# Patient Record
Sex: Male | Born: 1950
Health system: Southern US, Community
[De-identification: ages and names within clinical notes are randomized; demographics above are authoritative.]

## PROBLEM LIST (undated history)

## (undated) DIAGNOSIS — S3992XA Unspecified injury of lower back, initial encounter: Secondary | ICD-10-CM

## (undated) DIAGNOSIS — E785 Hyperlipidemia, unspecified: Secondary | ICD-10-CM

## (undated) DIAGNOSIS — R3129 Other microscopic hematuria: Secondary | ICD-10-CM

## (undated) DIAGNOSIS — H269 Unspecified cataract: Secondary | ICD-10-CM

## (undated) DIAGNOSIS — S32000A Wedge compression fracture of unspecified lumbar vertebra, initial encounter for closed fracture: Secondary | ICD-10-CM

## (undated) HISTORY — PX: WISDOM TOOTH EXTRACTION: SHX21

## (undated) HISTORY — DX: Hyperlipidemia, unspecified: E78.5

## (undated) HISTORY — PX: COLONOSCOPY: SHX174

## (undated) HISTORY — DX: Unspecified injury of lower back, initial encounter: S39.92XA

## (undated) HISTORY — DX: Other microscopic hematuria: R31.29

## (undated) HISTORY — PX: HERNIA REPAIR: SHX51

## (undated) HISTORY — DX: Wedge compression fracture of unspecified lumbar vertebra, initial encounter for closed fracture: S32.000A

## (undated) HISTORY — DX: Unspecified cataract: H26.9

---

## 1977-09-26 DIAGNOSIS — S32000A Wedge compression fracture of unspecified lumbar vertebra, initial encounter for closed fracture: Secondary | ICD-10-CM

## 1977-09-26 HISTORY — DX: Wedge compression fracture of unspecified lumbar vertebra, initial encounter for closed fracture: S32.000A

## 1983-12-31 DIAGNOSIS — S3992XA Unspecified injury of lower back, initial encounter: Secondary | ICD-10-CM

## 1983-12-31 HISTORY — DX: Unspecified injury of lower back, initial encounter: S39.92XA

## 1985-11-26 DIAGNOSIS — R3129 Other microscopic hematuria: Secondary | ICD-10-CM

## 1985-11-26 HISTORY — DX: Other microscopic hematuria: R31.29

## 1998-06-13 ENCOUNTER — Encounter: Admission: RE | Admit: 1998-06-13 | Discharge: 1998-06-13 | Payer: Self-pay | Admitting: Family Medicine

## 1998-06-17 ENCOUNTER — Encounter: Admission: RE | Admit: 1998-06-17 | Discharge: 1998-06-17 | Payer: Self-pay | Admitting: Family Medicine

## 1998-06-17 DIAGNOSIS — D229 Melanocytic nevi, unspecified: Secondary | ICD-10-CM

## 1998-06-17 HISTORY — DX: Melanocytic nevi, unspecified: D22.9

## 1999-06-18 ENCOUNTER — Encounter: Admission: RE | Admit: 1999-06-18 | Discharge: 1999-06-18 | Payer: Self-pay | Admitting: Family Medicine

## 1999-12-31 ENCOUNTER — Encounter: Admission: RE | Admit: 1999-12-31 | Discharge: 1999-12-31 | Payer: Self-pay | Admitting: Family Medicine

## 2000-02-23 ENCOUNTER — Encounter: Admission: RE | Admit: 2000-02-23 | Discharge: 2000-02-23 | Payer: Self-pay | Admitting: Sports Medicine

## 2000-03-17 ENCOUNTER — Encounter: Admission: RE | Admit: 2000-03-17 | Discharge: 2000-03-17 | Payer: Self-pay | Admitting: Family Medicine

## 2000-04-26 ENCOUNTER — Encounter: Admission: RE | Admit: 2000-04-26 | Discharge: 2000-04-26 | Payer: Self-pay | Admitting: Family Medicine

## 2001-06-06 ENCOUNTER — Encounter: Admission: RE | Admit: 2001-06-06 | Discharge: 2001-06-06 | Payer: Self-pay | Admitting: Family Medicine

## 2002-05-01 ENCOUNTER — Encounter: Admission: RE | Admit: 2002-05-01 | Discharge: 2002-05-01 | Payer: Self-pay | Admitting: Family Medicine

## 2003-06-18 ENCOUNTER — Encounter: Admission: RE | Admit: 2003-06-18 | Discharge: 2003-06-18 | Payer: Self-pay | Admitting: Family Medicine

## 2004-07-14 ENCOUNTER — Encounter: Admission: RE | Admit: 2004-07-14 | Discharge: 2004-07-14 | Payer: Self-pay | Admitting: Family Medicine

## 2004-09-15 ENCOUNTER — Ambulatory Visit: Payer: Self-pay | Admitting: Family Medicine

## 2004-09-24 ENCOUNTER — Ambulatory Visit (HOSPITAL_COMMUNITY): Admission: RE | Admit: 2004-09-24 | Discharge: 2004-09-24 | Payer: Self-pay | Admitting: Gastroenterology

## 2005-08-10 ENCOUNTER — Ambulatory Visit: Payer: Self-pay | Admitting: Family Medicine

## 2005-09-26 HISTORY — PX: LASIK: SHX215

## 2006-07-27 HISTORY — PX: NEVUS EXCISION: SHX2090

## 2006-08-23 ENCOUNTER — Ambulatory Visit: Payer: Self-pay | Admitting: Family Medicine

## 2006-10-18 ENCOUNTER — Ambulatory Visit: Payer: Self-pay | Admitting: Family Medicine

## 2006-11-01 ENCOUNTER — Ambulatory Visit: Payer: Self-pay | Admitting: Family Medicine

## 2006-11-22 ENCOUNTER — Ambulatory Visit: Payer: Self-pay | Admitting: Family Medicine

## 2007-02-23 DIAGNOSIS — Z87448 Personal history of other diseases of urinary system: Secondary | ICD-10-CM

## 2007-02-23 DIAGNOSIS — M545 Low back pain: Secondary | ICD-10-CM | POA: Insufficient documentation

## 2007-02-23 DIAGNOSIS — I10 Essential (primary) hypertension: Secondary | ICD-10-CM | POA: Insufficient documentation

## 2007-02-23 DIAGNOSIS — E785 Hyperlipidemia, unspecified: Secondary | ICD-10-CM

## 2007-02-23 DIAGNOSIS — L719 Rosacea, unspecified: Secondary | ICD-10-CM

## 2007-03-03 ENCOUNTER — Encounter: Payer: Self-pay | Admitting: Family Medicine

## 2007-03-08 ENCOUNTER — Encounter: Payer: Self-pay | Admitting: Family Medicine

## 2007-03-09 ENCOUNTER — Encounter: Payer: Self-pay | Admitting: Family Medicine

## 2007-03-13 ENCOUNTER — Encounter: Payer: Self-pay | Admitting: Family Medicine

## 2007-03-14 ENCOUNTER — Ambulatory Visit: Payer: Self-pay | Admitting: Family Medicine

## 2007-03-14 LAB — CONVERTED CEMR LAB: Cholesterol, target level: 200 mg/dL

## 2007-03-15 LAB — CONVERTED CEMR LAB
BUN: 22 mg/dL (ref 6–23)
CO2: 24 meq/L (ref 19–32)
Calcium: 9.1 mg/dL (ref 8.4–10.5)
Glucose, Bld: 86 mg/dL (ref 70–99)
Potassium: 4.3 meq/L (ref 3.5–5.3)

## 2007-09-12 ENCOUNTER — Ambulatory Visit: Payer: Self-pay | Admitting: Family Medicine

## 2007-09-12 LAB — CONVERTED CEMR LAB
AST: 32 units/L (ref 0–37)
Alkaline Phosphatase: 49 units/L (ref 39–117)
BUN: 24 mg/dL — ABNORMAL HIGH (ref 6–23)
Cholesterol: 156 mg/dL (ref 0–200)
Creatinine, Ser: 0.98 mg/dL (ref 0.40–1.50)
Glucose, Bld: 91 mg/dL (ref 70–99)
HDL: 42 mg/dL (ref >39)
Hemoglobin: 14.1 g/dL (ref 13.0–17.0)
MCHC: 33.2 g/dL (ref 30.0–36.0)
PSA: 1.21 ng/mL (ref 0.10–4.00)
Platelets: 167 10*3/uL (ref 150–400)
Sodium: 136 meq/L (ref 135–145)
Total Bilirubin: 0.6 mg/dL (ref 0.3–1.2)
Total CHOL/HDL Ratio: 3.7
VLDL: 19 mg/dL (ref 0–40)
WBC: 4.5 10*3/uL (ref 4.0–10.5)

## 2007-09-15 ENCOUNTER — Encounter: Payer: Self-pay | Admitting: Family Medicine

## 2007-10-03 ENCOUNTER — Telehealth: Payer: Self-pay | Admitting: Family Medicine

## 2007-10-05 ENCOUNTER — Ambulatory Visit: Payer: Self-pay | Admitting: Family Medicine

## 2008-03-11 ENCOUNTER — Encounter: Payer: Self-pay | Admitting: Family Medicine

## 2008-09-17 ENCOUNTER — Ambulatory Visit: Payer: Self-pay | Admitting: Family Medicine

## 2008-09-17 LAB — CONVERTED CEMR LAB
Albumin: 4.6 g/dL (ref 3.5–5.2)
CO2: 25 meq/L (ref 19–32)
Calcium: 9.9 mg/dL (ref 8.4–10.5)
Chloride: 100 meq/L (ref 96–112)
Cholesterol: 197 mg/dL (ref 0–200)
Creatinine, Ser: 1.05 mg/dL (ref 0.40–1.50)
Glucose, Bld: 93 mg/dL (ref 70–99)
Ketones, urine, test strip: NEGATIVE
LDL Cholesterol: 133 mg/dL — ABNORMAL HIGH (ref 0–99)
Nitrite: NEGATIVE
Total Protein: 7.1 g/dL (ref 6.0–8.3)
Triglycerides: 63 mg/dL (ref <150)
Urobilinogen, UA: 0.2
VLDL: 13 mg/dL (ref 0–40)
WBC Urine, dipstick: NEGATIVE

## 2008-09-18 ENCOUNTER — Encounter: Payer: Self-pay | Admitting: Family Medicine

## 2009-09-23 ENCOUNTER — Ambulatory Visit: Payer: Self-pay | Admitting: Family Medicine

## 2009-09-26 ENCOUNTER — Ambulatory Visit: Payer: Self-pay | Admitting: Family Medicine

## 2009-09-26 ENCOUNTER — Encounter: Payer: Self-pay | Admitting: Family Medicine

## 2009-09-26 LAB — CONVERTED CEMR LAB
ALT: 26 units/L (ref 0–53)
Albumin: 4.1 g/dL (ref 3.5–5.2)
BUN: 22 mg/dL (ref 6–23)
Calcium: 8.9 mg/dL (ref 8.4–10.5)
Chloride: 101 meq/L (ref 96–112)
Cholesterol: 156 mg/dL (ref 0–200)
HCT: 42.6 % (ref 39.0–52.0)
MCHC: 32.6 g/dL (ref 30.0–36.0)
MCV: 94.7 fL (ref 78.0–100.0)
Platelets: 198 10*3/uL (ref 150–400)
Potassium: 4.7 meq/L (ref 3.5–5.3)
RDW: 12.7 % (ref 11.5–15.5)
Total Protein: 6.5 g/dL (ref 6.0–8.3)
Triglycerides: 48 mg/dL (ref <150)
WBC: 5.2 10*3/uL (ref 4.0–10.5)

## 2009-10-03 ENCOUNTER — Encounter: Payer: Self-pay | Admitting: Family Medicine

## 2009-10-30 ENCOUNTER — Encounter: Payer: Self-pay | Admitting: Family Medicine

## 2010-01-08 ENCOUNTER — Inpatient Hospital Stay (HOSPITAL_COMMUNITY): Admission: EM | Admit: 2010-01-08 | Discharge: 2010-01-10 | Payer: Self-pay | Admitting: Emergency Medicine

## 2010-01-15 ENCOUNTER — Telehealth: Payer: Self-pay | Admitting: *Deleted

## 2010-01-20 ENCOUNTER — Ambulatory Visit: Payer: Self-pay | Admitting: Family Medicine

## 2010-02-10 ENCOUNTER — Telehealth: Payer: Self-pay | Admitting: Family Medicine

## 2010-02-10 DIAGNOSIS — R7401 Elevation of levels of liver transaminase levels: Secondary | ICD-10-CM | POA: Insufficient documentation

## 2010-02-10 DIAGNOSIS — R740 Nonspecific elevation of levels of transaminase and lactic acid dehydrogenase [LDH]: Secondary | ICD-10-CM

## 2010-02-12 ENCOUNTER — Encounter: Payer: Self-pay | Admitting: Family Medicine

## 2010-02-12 ENCOUNTER — Ambulatory Visit: Payer: Self-pay | Admitting: Family Medicine

## 2010-02-13 LAB — CONVERTED CEMR LAB
ALT: 21 units/L (ref 0–53)
AST: 18 units/L (ref 0–37)
Alkaline Phosphatase: 58 units/L (ref 39–117)
Indirect Bilirubin: 0.4 mg/dL (ref 0.0–0.9)

## 2010-03-03 ENCOUNTER — Ambulatory Visit: Payer: Self-pay | Admitting: Family Medicine

## 2010-03-03 DIAGNOSIS — S42009A Fracture of unspecified part of unspecified clavicle, initial encounter for closed fracture: Secondary | ICD-10-CM | POA: Insufficient documentation

## 2010-03-04 ENCOUNTER — Encounter: Payer: Self-pay | Admitting: Family Medicine

## 2010-03-10 ENCOUNTER — Telehealth: Payer: Self-pay | Admitting: Family Medicine

## 2010-09-22 ENCOUNTER — Telehealth: Payer: Self-pay | Admitting: *Deleted

## 2010-09-30 ENCOUNTER — Encounter: Payer: Self-pay | Admitting: Family Medicine

## 2010-09-30 ENCOUNTER — Ambulatory Visit: Payer: Self-pay | Admitting: Family Medicine

## 2010-09-30 LAB — CONVERTED CEMR LAB
ALT: 24 units/L (ref 0–53)
AST: 20 units/L (ref 0–37)
Albumin: 4.4 g/dL (ref 3.5–5.2)
Cholesterol: 160 mg/dL (ref 0–200)
HDL: 41 mg/dL (ref >39)
Potassium: 4.5 meq/L (ref 3.5–5.3)
Sodium: 135 meq/L (ref 135–145)
Total Protein: 6.6 g/dL (ref 6.0–8.3)
Triglycerides: 81 mg/dL (ref <150)
VLDL: 16 mg/dL (ref 0–40)

## 2010-10-20 ENCOUNTER — Ambulatory Visit: Payer: Self-pay | Admitting: Family Medicine

## 2010-10-20 DIAGNOSIS — Z6826 Body mass index (BMI) 26.0-26.9, adult: Secondary | ICD-10-CM

## 2010-10-20 DIAGNOSIS — M722 Plantar fascial fibromatosis: Secondary | ICD-10-CM | POA: Insufficient documentation
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
## 2011-01-26 NOTE — Progress Notes (Signed)
----   Converted from flag ---- ---- 09/22/2010 12:19 PM, [name] wrote: Please contact him to schedule lipid profile then visit for blood pressure follow-up ------------------------------  called pt to schedule fasting lab appt and f/u with Dr [name]. says he will call tomorrow morning to schedule both appts.

## 2011-01-26 NOTE — Assessment & Plan Note (Signed)
Summary: cpe, hypertension/eo   Vital Signs:  Patient Profile:   60 Years Old Male Height:     66 inches Weight:      173 pounds BMI:     28.02 Temp:     98.6 degrees F Pulse rate:   80 / minute BP sitting:   128 / 85  Vitals Entered By: Lillia Pauls CMA (September 17, 2008 10:14 AM)                Flu Vaccine Result Date:  09/17/2008 Flu Vaccine Result:  given Flu Vaccine Next Due:  1 yr Last Hemoccult Result: Done. (05/28/2003 12:00:00 AM) Hemoccult Next Due:  Not Indicated   PCP:  [name]  Chief Complaint:  cpe.  History of Present Illness: Feeling well, played a lot of tennis in the summer. Less exercise past month while getting 2 large courses underway, but plans to increase it.  Dr Ignacia Bayley surveyed skin last May. No new problems. Acne rosacea doing better since on Minocycline  Hasn't  seen Dr Melvyn Neth, chiro in past year, but gets [employer] for massages.  Was getting cramps in legs but recently better.   Has nocturia once nightly but no other urinary symptoms.   Hypertension History:      He denies headache, chest pain, palpitations, dyspnea with exertion, orthopnea, PND, peripheral edema, visual symptoms, neurologic problems, syncope, and side effects from treatment.  He notes no problems with any antihypertensive medication side effects.  Further comments include: Gained weight since less exercise than in the summer.        Positive major cardiovascular risk factors include male age 43 years old or older, hyperlipidemia, and hypertension.  Negative major cardiovascular risk factors include no history of diabetes, negative family history for ischemic heart disease, and non-tobacco-user status.        Further assessment for target organ damage reveals no history of ASHD, cardiac end-organ damage (CHF/LVH), stroke/TIA, peripheral vascular disease, renal insufficiency, or hypertensive retinopathy.        Current Allergies: No known allergies     Risk  Factors:  Colonoscopy History:     Date of Last Colonoscopy:  08/27/2004    Review of Systems  The patient denies anorexia, fever, weight loss, weight gain, vision loss, decreased hearing, hoarseness, chest pain, syncope, dyspnea on exertion, peripheral edema, prolonged cough, headaches, abdominal pain, melena, hematochezia, and severe indigestion/heartburn.     Physical Exam  General:     Well-developed,well-nourished,in no acute distress; alert,appropriate and cooperative throughout examination Head:     Normocephalic and atraumatic without obvious abnormalities.  Eyes:     pupils equal, pupils round, pupils reactive to light, and pupils react to accomodation. Fundi normal Ears:     External ear exam shows no significant lesions or deformities.  Otoscopic examination reveals clear canals, tympanic membranes are intact bilaterally without bulging, retraction, inflammation or discharge. Hearing is grossly normal bilaterally. Nose:     External nasal examination shows no deformity or inflammation. Nasal mucosa are pink and moist without lesions or exudates. Mouth:     good dentition and mild gingival recession.   Neck:     No deformities, masses, or tenderness noted. Chest Wall:     No deformities, masses, tenderness or gynecomastia noted. Lungs:     Normal respiratory effort, chest expands symmetrically. Lungs are clear to auscultation, no crackles or wheezes. Heart:     Normal rate and regular rhythm. S1 and S2 normal without gallop, murmur,  click, rub or other extra sounds. Abdomen:     Bowel sounds positive,abdomen soft and non-tender without masses, organomegaly or hernias noted. Genitalia:     Testes bilaterally descended without nodularity, tenderness or masses. No scrotal masses or lesions. No penis lesions or urethral discharge. Msk:     No deformity or scoliosis noted of thoracic or lumbar spine.   Pulses:     R and L dorsalis pedis and posterior tibial pulses are  full and equal bilaterally Neurologic:     No cranial nerve deficits noted. Station and gait are normal. DTRs are 3+ symmetrical throughout except 4+ R knee.  Sensory, motor and coordinative functions appear intact. Skin:     Multiple nevi on tanned back. None with worrisome symptoms. Mild flaking of forehead with erythema fo T zone and cheeks Cervical Nodes:     No lymphadenopathy noted Psych:     Cognition and judgment appear intact. Alert and cooperative with normal attention span and concentration. No apparent delusions, illusions, hallucinations    Impression & Recommendations:  Problem # 1:  HYPERTENSION, BENIGN SYSTEMIC (ICD-401.1) Assessment: Unchanged  His updated medication list for this problem includes:    Lisinopril-hydrochlorothiazide 10-12.5 Mg Tabs (Lisinopril-hydrochlorothiazide) .Marland Kitchen... Take 1 tablet by mouth once a day  Orders: [facility]- Est  Level 4 (99214) Comp Met-[facility] ([address]) Urinalysis-[facility] (00000)   Problem # 2:  ROSACEA (ICD-695.3) Assessment: Unchanged  Orders: [facility]- Est  Level 4 (99214)  Continue Minocycline    Problem # 3:  HYPERLIPIDEMIA (ICD-272.4) Need to see effectiveness of Simvastatin His updated medication list for this problem includes:    Simvastatin 40 Mg Tabs (Simvastatin) .Marland Kitchen... Take one tablet daily at bedtime  Orders: [facility]- Est  Level 4 (99214) Lipid-[facility] (09811-91478) Comp Met-[facility] (29562-13086)   Problem # 4:  HEMATURIA (ICD-599.7) Hasn't seen gross hematuria His updated medication list for this problem includes:    Minocycline Hcl 50 Mg Tabs (Minocycline hcl) .Marland Kitchen... Take 1/2 tablet by mouth once a day  Orders: Urinalysis-[facility] (00000)   Problem # 5:  BACK PAIN, LOW (ICD-724.2) Assessment: Improved  His updated medication list for this problem includes:    Bayer Childrens Aspirin 81 Mg Chew (Aspirin) .Marland Kitchen... Take 1 tablet by mouth every other day   Complete Medication List: 1)  Bayer Childrens Aspirin 81 Mg Chew (Aspirin)  .... Take 1 tablet by mouth every other day 2)  Lisinopril-hydrochlorothiazide 10-12.5 Mg Tabs (Lisinopril-hydrochlorothiazide) .... Take 1 tablet by mouth once a day 3)  Minocycline Hcl 50 Mg Tabs (Minocycline hcl) .... Take 1/2 tablet by mouth once a day 4)  Therapeutic Multivitamin Tabs (Multiple vitamin) .... Take one tablet daily 5)  Simvastatin 40 Mg Tabs (Simvastatin) .... Take one tablet daily at bedtime 6)  Fish Oil Concentrate 1000 Mg Caps (Omega-3 fatty acids) .... Take 2 tabs daily  Other Orders: PSA (Medicare)-[facility] (G0103)  Anticoagulation Management Assessment/Plan:       Hypertension Assessment/Plan:      The patient's hypertensive risk group is category B: At least one risk factor (excluding diabetes) with no target organ damage.  His calculated 10 year risk of coronary heart disease is 7 %.  Today's blood pressure is 128/85.     Patient Instructions: 1)  Please schedule a follow-up appointment in 1 year. 2)  Limit your Sodium (Salt). 3)  It is important that you exercise regularly at least 20 minutes 5 times a week. If you develop chest pain, have severe difficulty breathing, or feel very tired ,  stop exercising immediately and seek medical attention.   ]    Influenza Vaccine    Vaccine Type: FLUARIX    Site: left deltoid    Mfr: Gl[employer]oSmithKline    Dose: 0.5 ml    Route: IM    Given by: Lillia Pauls CMA    Exp. Date: 06/25/2009    Lot #: [id number]    VIS given: 07/20/07 version given September 17, 2008.   Laboratory Results   Urine Tests  Date/Time Received: September 17, 2008 11:11PM  Date/Time Reported: September 17, 2008 12:45 PM   Routine Urinalysis   Color: yellow Appearance: Clear Glucose: negative   (Normal Range: Negative) Bilirubin: negative   (Normal Range: Negative) Ketone: negative   (Normal Range: Negative) Spec. Gravity: 1.010   (Normal Range: 1.003-1.035) Blood: moderate   (Normal Range: Negative) pH: 6.0   (Normal Range:  5.0-8.0) Protein: negative   (Normal Range: Negative) Urobilinogen: 0.2   (Normal Range: 0-1) Nitrite: negative   (Normal Range: Negative) Leukocyte Esterace: negative   (Normal Range: Negative)  Urine Microscopic WBC/HPF: rare RBC/HPF: 1-5 Bacteria/HPF: 1+ Epithelial/HPF: rare    Comments: ...........test performed by...........Marland KitchenTerese Door, CMA
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
## 2011-01-26 NOTE — Progress Notes (Signed)
Summary: Increase in lisinopril  Phone Note Call from Patient   Caller: email from patient Summary of Call: Below are blood pressure readings.  3/9 144/85 7:45 am 134/70 2 pm   3/10 143/83 6:30 am 136/79 7:50 pm   3/11 157/78 2:45   3/12 146/77 6:40 am 152/72 2 pm   3/13 144/82 7 am     Follow-up for Phone Call        Your systolics are mostly above 140 so I'd like to increase the Lisinopril in your blood pressure medication. No rush, so you'll just get the larger dose in your next refill. Please make a follow-up appointment in 2-3 months Follow-up by: [name],  March 10, 2010 5:00 PM    New/Updated Medications: LISINOPRIL-HYDROCHLOROTHIAZIDE 20-12.5 MG TABS (LISINOPRIL-HYDROCHLOROTHIAZIDE) Take one tablet daily Prescriptions: LISINOPRIL-HYDROCHLOROTHIAZIDE 20-12.5 MG TABS (LISINOPRIL-HYDROCHLOROTHIAZIDE) Take one tablet daily  #30 x 11   Entered and Authorized by:   [name]   Signed by:   [name] on 03/10/2010   Method used:   Electronically to        [facility]* (retail)       [address]       [place], [place]  [id number]       Ph: [phone]       Fax: [phone]   RxID:   [id number]

## 2011-01-26 NOTE — Letter (Signed)
Summary: Results Follow-up Letter  [facility]  [address]   [place], [place] [address]   Phone: [phone]  Fax: [phone]    09/18/2008  [address] [place], [place]  [address]  Dear [name], Patient: Sean Schwartz Note: All result statuses are Final unless otherwise noted.  Tests: (1) Comprehensive Metabolic Panel (53614)   Order Note: FASTING   Sodium                    137 mEq/L                   135-145   Potassium                 4.4 mEq/L                   3.5-5.3   Chloride                  100 mEq/L                   96-112   CO2                       25 mEq/L                    19-32   Glucose                   93 mg/dL                    43-15   BUN                       21 mg/dL                    4-00   Creatinine                1.05 mg/dL                  0.40-1.50   Bilirubin, Total          1.0 mg/dL                   8.6-7.6   Alkaline Phosphatase      46 U/L                      39-117   AST/SGOT                  22 U/L                      0-37   ALT/SGPT                  41 U/L                      0-53   Total Protein             7.1 g/dL                    1.9-5.0   Albumin                   4.6 g/dL  3.5-5.2   Calcium                   9.9 mg/dL                   4.5-40.9 No sign of kidney or liver problems Tests: (2) Lipid Profile (81191)   Cholesterol               197 mg/dL                   4-782         Triglyceride              63 mg/dL                    <956   HDL Cholesterol           51 mg/dL                    >21   Total Chol/HDL Ratio      3.9 Ratio  VLDL Cholesterol (Calc)                             13 mg/dL                    3-08  LDL Cholesterol (Calc)                        [H]  133 mg/dL                   6-57           Total Cholesterol/HDL Ratio:CHD Risk                            Coronary Heart Disease Risk Table                                            Men       Women             1/2 Average Risk              3.4        3.3                  Average Risk              5.0        4.4              2 X Average Risk              9.6        7.1              3 X Average Risk             23.4       11.0     Use the calculated Patient Ratio above and the CHD Risk table      to determine the patient's CHD Risk.     ATP III Classification (LDL):           < 100        mg/dL  Optimal          100 - 129     mg/dL         Near or Above Optimal          130 - 159     mg/dL         Borderline High          160 - 189     mg/dL         High           > 190        mg/dL         Very High Since you are taking medication, we should get the LDL below 130, so I sent in to your pharmacy for the subsequent dose to be Simvastatin 80 mg. I don't think we need repeat the lipid profile for a year.      Tests: (3) PSA ([address])   PSA                       2.09 ng/mL                  0.10-4.00 This has risen from 1.21 so we'll recheck it next year.  Document Creation Date: 09/18/2008 7:22 AM Sincerely,  [name] [facility]           Appended Document: Increase Simvastatin to 80 mg    Clinical Lists Changes  Medications: Changed medication from SIMVASTATIN 40 MG  TABS (SIMVASTATIN) Take one tablet daily at bedtime to SIMVASTATIN 80 MG TABS (SIMVASTATIN) Take one tablet daily at bedtime - Signed Rx of SIMVASTATIN 80 MG TABS (SIMVASTATIN) Take one tablet daily at bedtime;  #30 x 11;  Signed;  Entered by: [name];  Authorized by: [name];  Method used: Electronically to [facility]*, [address] [place], [place]  [id number], Ph: [phone], Fax: [phone]    Prescriptions: SIMVASTATIN 80 MG TABS (SIMVASTATIN) Take one tablet daily at bedtime  #30 x 11   Entered and Authorized by:   [name]   Signed by:   [name] on 09/18/2008   Method used:   Electronically to        [facility]* (retail)        [address]       [place], [place]  [id number]       Ph: [phone]       Fax: [phone]   RxID:   [id number]   Appended Document: Results Follow-up Letter sent

## 2011-01-26 NOTE — Letter (Signed)
Summary: Generic Letter  [facility]  [address]   [place], [place] [address]   Phone: [phone]  Fax: [phone]    03/04/2010  [facility] [address] [place], [place] [address]   Dear Sean Schwartz,  I examined Mr Sean Schwartz March 8th, 2011 using the standard alcoholism screens. On the CAGE he scored negative for any risk. On the short [place] Alcoholism Screening Test he scored 2 positives out of 13 which puts him in the possible risk range. Since I have treated him for a number of years for hypertension and hyperlipidemia, I have annual liver function tests and blood counts which have never shown indications of the effects of chronic excessive alcohol use. Based on these tests and my experience in treating him, I do not believe he needs referral for alcholism treatment. We discussed the risks of alcohol related to driving, and I recommended no alcohol intake proximate to driving a motor vehicle in the future.   Please contact me if any more specific information would be useful.     Sincerely,   Sean Dauer MD  Appended Document: Generic Letter mailed

## 2011-01-26 NOTE — Letter (Signed)
Summary: SMAST Questionnaire  SMAST Questionnaire   Imported By: Clydell Hakim 03/06/2010 10:18:38  _____________________________________________________________________  External Attachment:    Type:   Image     Comment:   External Document
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
## 2011-03-14 LAB — COMPREHENSIVE METABOLIC PANEL
Albumin: 4.5 g/dL (ref 3.5–5.2)
Alkaline Phosphatase: 37 U/L — ABNORMAL LOW (ref 39–117)
Alkaline Phosphatase: 47 U/L (ref 39–117)
BUN: 17 mg/dL (ref 6–23)
Calcium: 8.4 mg/dL (ref 8.4–10.5)
Chloride: 102 mEq/L (ref 96–112)
Creatinine, Ser: 0.84 mg/dL (ref 0.4–1.5)
Creatinine, Ser: 0.94 mg/dL (ref 0.4–1.5)
GFR calc Af Amer: >60 mL/min (ref >60)
Glucose, Bld: 140 mg/dL — ABNORMAL HIGH (ref 70–99)
Potassium: 4.4 mEq/L (ref 3.5–5.1)
Sodium: 139 mEq/L (ref 135–145)
Total Bilirubin: 0.9 mg/dL (ref 0.3–1.2)
Total Protein: 6 g/dL (ref 6.0–8.3)

## 2011-03-14 LAB — URINALYSIS, ROUTINE W REFLEX MICROSCOPIC
Glucose, UA: NEGATIVE mg/dL
Leukocytes, UA: NEGATIVE
Nitrite: NEGATIVE
Protein, ur: 30 mg/dL — AB
Specific Gravity, Urine: 1.011 (ref 1.005–1.030)
pH: 6.5 (ref 5.0–8.0)

## 2011-03-14 LAB — CBC
HCT: 46.4 % (ref 39.0–52.0)
Hemoglobin: 16.2 g/dL (ref 13.0–17.0)
MCHC: 35 g/dL (ref 30.0–36.0)
Platelets: 139 10*3/uL — ABNORMAL LOW (ref 150–400)
RDW: 12.8 % (ref 11.5–15.5)
WBC: 10.3 10*3/uL (ref 4.0–10.5)

## 2011-03-14 LAB — RAPID URINE DRUG SCREEN, HOSP PERFORMED
Opiates: NOT DETECTED
Tetrahydrocannabinol: NOT DETECTED

## 2011-03-14 LAB — DIFFERENTIAL
Basophils Relative: 1 % (ref 0–1)
Lymphocytes Relative: 28 % (ref 12–46)
Lymphs Abs: 2.3 10*3/uL (ref 0.7–4.0)
Monocytes Relative: 7 % (ref 3–12)
Neutrophils Relative %: 62 % (ref 43–77)

## 2011-03-14 LAB — URINE MICROSCOPIC-ADD ON

## 2011-03-14 LAB — PROTIME-INR: Prothrombin Time: 13.2 seconds (ref 11.6–15.2)

## 2011-03-14 LAB — APTT: aPTT: 27 seconds (ref 24–37)

## 2011-03-14 LAB — ETHANOL: Alcohol, Ethyl (B): 123 mg/dL — ABNORMAL HIGH (ref 0–10)

## 2011-04-15 ENCOUNTER — Other Ambulatory Visit: Payer: Self-pay | Admitting: Family Medicine

## 2011-04-15 NOTE — Telephone Encounter (Signed)
Refill request

## 2011-05-14 NOTE — Op Note (Signed)
Sean Schwartz, Sean Schwartz           ACCOUNT NO.:  [id number]   MEDICAL RECORD NO.:  [id number]          PATIENT TYPE:  AMB   LOCATION:  ENDO                         FACILITY:  [facility]   PHYSICIAN:  James L. Malon Kindle., M.D.DATE OF BIRTH:  1951-10-29   DATE OF PROCEDURE:  09/24/2004  DATE OF DISCHARGE:                                 OPERATIVE REPORT   PROCEDURE:  Colonoscopy.   MEDICATIONS:  Fentanyl 87.5 mcg, Versed 9 mg IV.   INDICATIONS FOR PROCEDURE:  Rectal bleeding.   INSTRUMENT USED:  Olympus pediatric adjustable colonoscope.   DESCRIPTION OF PROCEDURE:  The procedure had been explained to the patient  and consent obtained.  In the left lateral decubitus position, the Olympus  scope was inserted and advanced.  The prep was excellent.  We were able to  easily reach the cecum.  The ileocecal valve and appendiceal orifice were  seen.  The scope was withdrawn.  The cecum, ascending colon, transverse  colon, splenic flexure, descending, and sigmoid colon were seen well.  No  polyps were seen.  No significant diverticular disease.  In the rectum, no  polyps were seen.  There were moderate internal hemorrhoids.  No other  abnormalities were seen.  The scope was withdrawn.  The patient tolerated  the procedure well.   ASSESSMENT:  1.  Rectal bleeding probably due to internal hemorrhoids (578.1).  2.  Internal hemorrhoids (455.0).   PLAN:  Will given a hemorrhoid instruction sheet and see back on an as  needed basis.      JLE/MEDQ  D:  09/24/2004  T:  09/24/2004  Job:  [id number]   cc:   Deniece Portela A. [name], M.D.  Fax: [phone]
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
## 2011-09-11 ENCOUNTER — Other Ambulatory Visit: Payer: Self-pay | Admitting: Family Medicine

## 2011-09-11 DIAGNOSIS — E785 Hyperlipidemia, unspecified: Secondary | ICD-10-CM

## 2011-09-11 DIAGNOSIS — I10 Essential (primary) hypertension: Secondary | ICD-10-CM

## 2011-09-21 ENCOUNTER — Other Ambulatory Visit: Payer: BC Managed Care – PPO

## 2011-09-21 DIAGNOSIS — E785 Hyperlipidemia, unspecified: Secondary | ICD-10-CM

## 2011-09-21 DIAGNOSIS — I10 Essential (primary) hypertension: Secondary | ICD-10-CM

## 2011-09-21 LAB — COMPREHENSIVE METABOLIC PANEL
Albumin: 4.4 g/dL (ref 3.5–5.2)
Alkaline Phosphatase: 43 U/L (ref 39–117)
BUN: 21 mg/dL (ref 6–23)
CO2: 23 mEq/L (ref 19–32)
Glucose, Bld: 91 mg/dL (ref 70–99)
Potassium: 4.6 mEq/L (ref 3.5–5.3)
Total Bilirubin: 0.9 mg/dL (ref 0.3–1.2)

## 2011-09-21 LAB — LIPID PANEL
Cholesterol: 173 mg/dL (ref 0–200)
HDL: 56 mg/dL (ref >39)
Total CHOL/HDL Ratio: 3.1 Ratio

## 2011-09-21 NOTE — Progress Notes (Signed)
cmp and flp done today Sean Schwartz 

## 2011-10-19 ENCOUNTER — Encounter: Payer: Self-pay | Admitting: Family Medicine

## 2011-10-19 ENCOUNTER — Ambulatory Visit (INDEPENDENT_AMBULATORY_CARE_PROVIDER_SITE_OTHER): Payer: BC Managed Care – PPO | Admitting: Family Medicine

## 2011-10-19 VITALS — BP 124/80 | HR 77 | Ht 66.0 in | Wt 172.0 lb

## 2011-10-19 DIAGNOSIS — I10 Essential (primary) hypertension: Secondary | ICD-10-CM

## 2011-10-19 DIAGNOSIS — M545 Low back pain: Secondary | ICD-10-CM

## 2011-10-19 DIAGNOSIS — Z23 Encounter for immunization: Secondary | ICD-10-CM

## 2011-10-19 DIAGNOSIS — E785 Hyperlipidemia, unspecified: Secondary | ICD-10-CM

## 2011-10-19 MED ORDER — ZOSTER VACCINE LIVE 19400 UNT/0.65ML ~~LOC~~ SOLR: 0.6500 mL | Freq: Once | SUBCUTANEOUS | Status: DC

## 2011-10-19 NOTE — Patient Instructions (Signed)
Return in one year to see Dr [name], sooner as needed

## 2011-10-19 NOTE — Assessment & Plan Note (Signed)
well controlled  
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
## 2011-12-08 ENCOUNTER — Other Ambulatory Visit: Payer: Self-pay | Admitting: Family Medicine

## 2011-12-08 DIAGNOSIS — E785 Hyperlipidemia, unspecified: Secondary | ICD-10-CM

## 2011-12-08 MED ORDER — SIMVASTATIN 80 MG PO TABS: 80.0000 mg | ORAL_TABLET | Freq: Every day | ORAL | Status: DC

## 2012-02-07 ENCOUNTER — Other Ambulatory Visit: Payer: Self-pay | Admitting: Family Medicine

## 2012-02-07 NOTE — Telephone Encounter (Signed)
Refill request

## 2012-04-28 ENCOUNTER — Other Ambulatory Visit: Payer: Self-pay | Admitting: Family Medicine

## 2012-05-01 ENCOUNTER — Other Ambulatory Visit: Payer: Self-pay | Admitting: Family Medicine

## 2012-09-04 ENCOUNTER — Telehealth: Payer: Self-pay | Admitting: Family Medicine

## 2012-09-04 NOTE — Telephone Encounter (Signed)
Patient called to set up his Physical appt.  He also scheduled for the typical labs that are ordered for his Physical, but he needs orders put in for those labs.

## 2012-09-04 NOTE — Telephone Encounter (Signed)
Will forward to Dr Hale 

## 2012-10-16 ENCOUNTER — Other Ambulatory Visit: Payer: BC Managed Care – PPO

## 2012-10-16 ENCOUNTER — Telehealth: Payer: Self-pay | Admitting: *Deleted

## 2012-10-16 DIAGNOSIS — I10 Essential (primary) hypertension: Secondary | ICD-10-CM

## 2012-10-16 DIAGNOSIS — E785 Hyperlipidemia, unspecified: Secondary | ICD-10-CM

## 2012-10-16 NOTE — Progress Notes (Signed)
CMP AND FLP DONE TODAY PER VERBAL ORDER FROM DR. HALE Sotirios Navarro

## 2012-10-16 NOTE — Telephone Encounter (Signed)
Lab orders put in per Dr.Hale. Lorenda Hatchet, Renato Battles

## 2012-10-17 LAB — COMPREHENSIVE METABOLIC PANEL
ALT: 21 U/L (ref 0–53)
AST: 21 U/L (ref 0–37)
BUN: 18 mg/dL (ref 6–23)
Chloride: 100 mEq/L (ref 96–112)
Potassium: 4.5 mEq/L (ref 3.5–5.3)
Sodium: 138 mEq/L (ref 135–145)

## 2012-10-17 LAB — LIPID PANEL
Cholesterol: 169 mg/dL (ref 0–200)
Total CHOL/HDL Ratio: 3.4 Ratio
Triglycerides: 72 mg/dL (ref <150)
VLDL: 14 mg/dL (ref 0–40)

## 2012-10-23 ENCOUNTER — Encounter: Payer: Self-pay | Admitting: Family Medicine

## 2012-10-23 ENCOUNTER — Ambulatory Visit (INDEPENDENT_AMBULATORY_CARE_PROVIDER_SITE_OTHER): Payer: BC Managed Care – PPO | Admitting: Family Medicine

## 2012-10-23 VITALS — BP 135/78 | HR 88 | Ht 65.25 in | Wt 166.0 lb

## 2012-10-23 DIAGNOSIS — L719 Rosacea, unspecified: Secondary | ICD-10-CM

## 2012-10-23 DIAGNOSIS — G8929 Other chronic pain: Secondary | ICD-10-CM | POA: Insufficient documentation

## 2012-10-23 DIAGNOSIS — M899 Disorder of bone, unspecified: Secondary | ICD-10-CM

## 2012-10-23 DIAGNOSIS — E785 Hyperlipidemia, unspecified: Secondary | ICD-10-CM

## 2012-10-23 DIAGNOSIS — I10 Essential (primary) hypertension: Secondary | ICD-10-CM

## 2012-10-23 NOTE — Patient Instructions (Addendum)
Please return to see Dr [name] in 1 year.  Please try using the web portal, My chart  Great to see you doing so well.
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
## 2012-10-23 NOTE — Assessment & Plan Note (Signed)
well controlled  

## 2012-10-23 NOTE — Assessment & Plan Note (Signed)
Continues on Doxycycline

## 2012-10-23 NOTE — Assessment & Plan Note (Signed)
LDL adequately low. He wishes to continue the Simvastatin

## 2012-11-05 ENCOUNTER — Other Ambulatory Visit: Payer: Self-pay | Admitting: Family Medicine

## 2012-12-10 ENCOUNTER — Other Ambulatory Visit: Payer: Self-pay | Admitting: Family Medicine

## 2012-12-12 NOTE — Telephone Encounter (Signed)
Patient is calling asking for the refill on HCTZ to be added to the refill request sent by his pharmacy.  Also, his password for My Chart has expired and he would like a new one.

## 2012-12-12 NOTE — Telephone Encounter (Signed)
Fwd. To Dr.Hale

## 2013-10-26 ENCOUNTER — Other Ambulatory Visit: Payer: BC Managed Care – PPO

## 2013-10-29 ENCOUNTER — Ambulatory Visit (INDEPENDENT_AMBULATORY_CARE_PROVIDER_SITE_OTHER): Payer: BC Managed Care – PPO | Admitting: Family Medicine

## 2013-10-29 ENCOUNTER — Encounter: Payer: BC Managed Care – PPO | Admitting: Family Medicine

## 2013-10-29 VITALS — BP 132/74 | HR 74 | Temp 98.0°F | Resp 18 | Ht 65.25 in | Wt 163.4 lb

## 2013-10-29 DIAGNOSIS — I10 Essential (primary) hypertension: Secondary | ICD-10-CM

## 2013-10-29 DIAGNOSIS — Z23 Encounter for immunization: Secondary | ICD-10-CM

## 2013-10-29 DIAGNOSIS — E785 Hyperlipidemia, unspecified: Secondary | ICD-10-CM

## 2013-10-29 LAB — POCT URINALYSIS DIPSTICK
Bilirubin, UA: NEGATIVE
Glucose, UA: NEGATIVE
Ketones, UA: NEGATIVE
Leukocytes, UA: NEGATIVE
Nitrite, UA: NEGATIVE
Protein, UA: NEGATIVE
Spec Grav, UA: 1.015
Urobilinogen, UA: 0.2
pH, UA: 6

## 2013-10-29 LAB — POCT CBC
Granulocyte percent: 69.5 % (ref 37–80)
HCT, POC: 49 % (ref 43.5–53.7)
Hemoglobin: 15.8 g/dL (ref 14.1–18.1)
Lymph, poc: 1.2 (ref 0.6–3.4)
MCH, POC: 31.5 pg — AB (ref 27–31.2)
MCHC: 32.2 g/dL (ref 31.8–35.4)
MCV: 97.8 fL — AB (ref 80–97)
MID (cbc): 0.7 (ref 0–0.9)
MPV: 10 fL (ref 0–99.8)
POC Granulocyte: 4.4 (ref 2–6.9)
POC LYMPH PERCENT: 19.8 % (ref 10–50)
POC MID %: 10.7 % (ref 0–12)
Platelet Count, POC: 186 K/uL (ref 142–424)
RBC: 5.01 M/uL (ref 4.69–6.13)
RDW, POC: 13.4 %
WBC: 6.3 K/uL (ref 4.6–10.2)

## 2013-10-29 MED ORDER — INFLUENZA VAC SPLIT QUAD 0.5 ML IM SUSP: 0.5000 mL | INTRAMUSCULAR | Status: DC

## 2013-10-29 MED ORDER — SIMVASTATIN 80 MG PO TABS: 80.0000 mg | ORAL_TABLET | Freq: Every day | ORAL | Status: DC

## 2013-10-29 MED ORDER — DOXYCYCLINE HYCLATE 50 MG PO CAPS: 50.0000 mg | ORAL_CAPSULE | Freq: Every day | ORAL | Status: DC

## 2013-10-29 MED ORDER — LISINOPRIL-HYDROCHLOROTHIAZIDE 20-12.5 MG PO TABS: 1.0000 | ORAL_TABLET | Freq: Every day | ORAL | Status: DC
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
## 2013-10-30 ENCOUNTER — Encounter: Payer: Self-pay | Admitting: Family Medicine

## 2013-10-30 LAB — COMPREHENSIVE METABOLIC PANEL
ALT: 25 U/L (ref 0–53)
AST: 23 U/L (ref 0–37)
Albumin: 4.6 g/dL (ref 3.5–5.2)
Alkaline Phosphatase: 45 U/L (ref 39–117)
BUN: 17 mg/dL (ref 6–23)
CO2: 26 mEq/L (ref 19–32)
Calcium: 9.9 mg/dL (ref 8.4–10.5)
Chloride: 100 mEq/L (ref 96–112)
Creat: 0.95 mg/dL (ref 0.50–1.35)
Glucose, Bld: 88 mg/dL (ref 70–99)
Potassium: 4.7 mEq/L (ref 3.5–5.3)
Sodium: 137 mEq/L (ref 135–145)
Total Bilirubin: 0.8 mg/dL (ref 0.3–1.2)
Total Protein: 7.3 g/dL (ref 6.0–8.3)

## 2013-10-30 LAB — PSA: PSA: 2.53 ng/mL (ref <=4.00)

## 2013-10-30 LAB — LIPID PANEL
Cholesterol: 187 mg/dL (ref 0–200)
HDL: 59 mg/dL (ref >39)
LDL Cholesterol: 105 mg/dL — ABNORMAL HIGH (ref 0–99)
Total CHOL/HDL Ratio: 3.2 Ratio
Triglycerides: 115 mg/dL (ref <150)
VLDL: 23 mg/dL (ref 0–40)

## 2014-11-05 ENCOUNTER — Ambulatory Visit (INDEPENDENT_AMBULATORY_CARE_PROVIDER_SITE_OTHER): Payer: BC Managed Care – PPO | Admitting: Family Medicine

## 2014-11-05 VITALS — BP 130/62 | HR 112 | Temp 98.2°F | Resp 18 | Ht 66.5 in | Wt 163.2 lb

## 2014-11-05 DIAGNOSIS — Z23 Encounter for immunization: Secondary | ICD-10-CM

## 2014-11-05 DIAGNOSIS — Z0000 Encounter for general adult medical examination without abnormal findings: Secondary | ICD-10-CM

## 2014-11-05 DIAGNOSIS — L03221 Cellulitis of neck: Secondary | ICD-10-CM

## 2014-11-05 MED ORDER — AMOXICILLIN-POT CLAVULANATE 875-125 MG PO TABS: 1.0000 | ORAL_TABLET | Freq: Two times a day (BID) | ORAL | Status: DC

## 2014-11-05 MED ORDER — INFLUENZA VAC SPLIT QUAD 0.5 ML IM SUSY: 0.5000 mL | PREFILLED_SYRINGE | INTRAMUSCULAR | Status: DC

## 2014-11-05 NOTE — Progress Notes (Signed)
   Subjective:    Patient ID: BHAVIK CABINESS, male    DOB: 1951-07-30, 63 y.o.   MRN: [id number] This chart was scribed for Robyn Haber, MD by Zola Button, Medical Scribe. This patient was seen in Room 12 and the patient's care was started at 8:35 AM.   HPI HPI Comments: JACKY DROSS is a 63 y.o. male who presents to the [facility] complaining of an itching rash on his face/neck area that began yesterday. Patient thinks it may be due to shaving; he has not shaved in the area today. He has no other rashes. Patient has tried applying Aveeno to the area. He does not have allergies to penicillin. He has not had a flu shot yet, but would like to have it done today.  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   Review of Systems  Constitutional: Negative for fever.  HENT: Negative for ear discharge.   Eyes: Negative for discharge.  Respiratory: Negative for cough.   Cardiovascular: Negative for chest pain.  Gastrointestinal: Negative for diarrhea.  Genitourinary: Negative for hematuria.  Musculoskeletal: Negative for back pain.  Skin: Positive for rash.  Neurological: Negative for seizures.  Psychiatric/Behavioral: Negative for hallucinations.       Objective:   Physical Exam CONSTITUTIONAL: Well developed/well nourished HEAD: Normocephalic/atraumatic EYES: EOM/PERRL ENMT: Mucous membranes moist NECK: supple no meningeal signs SPINE: entire spine nontender CV: S1/S2 noted, no murmurs/rubs/gallops noted LUNGS: Lungs are clear to auscultation bilaterally, no apparent distress ABDOMEN: soft, nontender, no rebound or guarding GU: no cva tenderness NEURO: Pt is awake/alert, moves all extremitiesx4 EXTREMITIES: pulses normal, full ROM SKIN: warm, reddened and slightly thickened well-circumscribed area on the neck, anteriorly over the larynx and lower trachea with some scaling (exfoliation) an area approximately 4 x 5 cm PSYCH: no abnormalities  of mood noted        Assessment & Plan:   Preventative health care - Plan: DISCONTINUED: Influenza vac split quadrivalent PF (FLUARIX) injection 0.5 mL  Cellulitis of neck - Plan: amoxicillin-clavulanate (AUGMENTIN) 875-125 MG per tablet Follow-up next Monday with complete physical exam  Avoid shaving over affected area Signed, Robyn Haber, MD

## 2014-11-05 NOTE — Patient Instructions (Signed)

## 2014-11-11 ENCOUNTER — Encounter: Payer: Self-pay | Admitting: Family Medicine

## 2014-11-11 ENCOUNTER — Ambulatory Visit (INDEPENDENT_AMBULATORY_CARE_PROVIDER_SITE_OTHER): Payer: BC Managed Care – PPO | Admitting: Family Medicine

## 2014-11-11 VITALS — BP 116/60 | HR 69 | Temp 98.2°F | Resp 16 | Ht 65.5 in | Wt 159.6 lb

## 2014-11-11 DIAGNOSIS — I10 Essential (primary) hypertension: Secondary | ICD-10-CM

## 2014-11-11 DIAGNOSIS — Z0000 Encounter for general adult medical examination without abnormal findings: Secondary | ICD-10-CM

## 2014-11-11 DIAGNOSIS — L719 Rosacea, unspecified: Secondary | ICD-10-CM

## 2014-11-11 DIAGNOSIS — E785 Hyperlipidemia, unspecified: Secondary | ICD-10-CM

## 2014-11-11 DIAGNOSIS — L03221 Cellulitis of neck: Secondary | ICD-10-CM

## 2014-11-11 LAB — COMPLETE METABOLIC PANEL WITH GFR
ALT: 26 U/L (ref 0–53)
AST: 24 U/L (ref 0–37)
Albumin: 4.7 g/dL (ref 3.5–5.2)
Alkaline Phosphatase: 49 U/L (ref 39–117)
BUN: 20 mg/dL (ref 6–23)
CO2: 25 mEq/L (ref 19–32)
Calcium: 10.3 mg/dL (ref 8.4–10.5)
Chloride: 99 mEq/L (ref 96–112)
Creat: 0.86 mg/dL (ref 0.50–1.35)
GFR, Est African American: >89 mL/min
GFR, Est Non African American: >89 mL/min
Glucose, Bld: 86 mg/dL (ref 70–99)
Potassium: 4.6 mEq/L (ref 3.5–5.3)
Sodium: 136 mEq/L (ref 135–145)
Total Bilirubin: 1.1 mg/dL (ref 0.2–1.2)
Total Protein: 8.1 g/dL (ref 6.0–8.3)

## 2014-11-11 LAB — CBC WITH DIFFERENTIAL/PLATELET
Basophils Absolute: 0.1 10*3/uL (ref 0.0–0.1)
Basophils Relative: 1 % (ref 0–1)
Eosinophils Absolute: 0.2 10*3/uL (ref 0.0–0.7)
Eosinophils Relative: 3 % (ref 0–5)
HCT: 45.1 % (ref 39.0–52.0)
Hemoglobin: 15.9 g/dL (ref 13.0–17.0)
Lymphocytes Relative: 25 % (ref 12–46)
Lymphs Abs: 1.4 10*3/uL (ref 0.7–4.0)
MCH: 31.3 pg (ref 26.0–34.0)
MCHC: 35.3 g/dL (ref 30.0–36.0)
MCV: 88.8 fL (ref 78.0–100.0)
MPV: 10.3 fL (ref 9.4–12.4)
Monocytes Absolute: 0.7 10*3/uL (ref 0.1–1.0)
Monocytes Relative: 12 % (ref 3–12)
Neutro Abs: 3.3 10*3/uL (ref 1.7–7.7)
Neutrophils Relative %: 59 % (ref 43–77)
Platelets: 223 10*3/uL (ref 150–400)
RBC: 5.08 MIL/uL (ref 4.22–5.81)
RDW: 12.6 % (ref 11.5–15.5)
WBC: 5.6 10*3/uL (ref 4.0–10.5)

## 2014-11-11 LAB — POCT URINALYSIS DIPSTICK
Bilirubin, UA: NEGATIVE
Glucose, UA: NEGATIVE
Ketones, UA: NEGATIVE
Leukocytes, UA: NEGATIVE
Nitrite, UA: NEGATIVE
Protein, UA: NEGATIVE
Spec Grav, UA: <=1.005
Urobilinogen, UA: 0.2
pH, UA: 5

## 2014-11-11 LAB — LIPID PANEL
Cholesterol: 183 mg/dL (ref 0–200)
HDL: 61 mg/dL (ref >39)
LDL Cholesterol: 104 mg/dL — ABNORMAL HIGH (ref 0–99)
Total CHOL/HDL Ratio: 3 Ratio
Triglycerides: 89 mg/dL (ref <150)
VLDL: 18 mg/dL (ref 0–40)

## 2014-11-11 LAB — IFOBT (OCCULT BLOOD): IFOBT: NEGATIVE

## 2014-11-11 MED ORDER — LISINOPRIL-HYDROCHLOROTHIAZIDE 20-12.5 MG PO TABS: 1.0000 | ORAL_TABLET | Freq: Every day | ORAL | Status: DC

## 2014-11-11 MED ORDER — SIMVASTATIN 80 MG PO TABS: 80.0000 mg | ORAL_TABLET | Freq: Every day | ORAL | Status: DC

## 2014-11-11 MED ORDER — DOXYCYCLINE HYCLATE 100 MG PO TABS: 100.0000 mg | ORAL_TABLET | Freq: Every day | ORAL | Status: DC
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
## 2014-11-12 ENCOUNTER — Encounter: Payer: Self-pay | Admitting: Radiology

## 2014-11-12 LAB — PSA: PSA: 2.6 ng/mL (ref <=4.00)

## 2014-11-18 ENCOUNTER — Telehealth: Payer: Self-pay | Admitting: *Deleted

## 2014-11-18 NOTE — Telephone Encounter (Signed)
Dr. Joseph Schwartz, Pt called and states that he finished his antibiotics 3 days ago and the cellulitis on his neck has returned but not as bad as it was before. He wants to know if we should call him in another round of antibiotics. Please advise. [phone] He uses [facility]

## 2014-11-19 ENCOUNTER — Other Ambulatory Visit: Payer: Self-pay | Admitting: Family Medicine

## 2014-11-19 DIAGNOSIS — L03221 Cellulitis of neck: Secondary | ICD-10-CM

## 2014-11-19 MED ORDER — AMOXICILLIN-POT CLAVULANATE 875-125 MG PO TABS: 1.0000 | ORAL_TABLET | Freq: Two times a day (BID) | ORAL | Status: DC

## 2015-05-17 ENCOUNTER — Ambulatory Visit (INDEPENDENT_AMBULATORY_CARE_PROVIDER_SITE_OTHER): Payer: BC Managed Care – PPO | Admitting: Family Medicine

## 2015-05-17 VITALS — BP 130/70 | HR 79 | Temp 98.4°F | Resp 16 | Ht 64.5 in | Wt 163.4 lb

## 2015-05-17 DIAGNOSIS — S0181XA Laceration without foreign body of other part of head, initial encounter: Secondary | ICD-10-CM | POA: Diagnosis not present

## 2015-05-17 NOTE — Patient Instructions (Signed)
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
## 2015-05-18 ENCOUNTER — Encounter: Payer: Self-pay | Admitting: Family Medicine
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
## 2015-05-22 ENCOUNTER — Ambulatory Visit (INDEPENDENT_AMBULATORY_CARE_PROVIDER_SITE_OTHER): Payer: BC Managed Care – PPO | Admitting: Physician Assistant

## 2015-05-22 VITALS — BP 110/68 | HR 86 | Temp 98.2°F | Resp 18 | Ht 65.0 in | Wt 163.0 lb

## 2015-05-22 DIAGNOSIS — Z4802 Encounter for removal of sutures: Secondary | ICD-10-CM

## 2015-05-22 DIAGNOSIS — S0181XD Laceration without foreign body of other part of head, subsequent encounter: Secondary | ICD-10-CM

## 2015-05-22 NOTE — Progress Notes (Signed)
   05/22/2015 at 8:28 AM  Sean Schwartz / DOB: 03-Dec-1951 / MRN: [id number]  The patient has HYPERLIPIDEMIA; OVERWEIGHT; HYPERTENSION, BENIGN SYSTEMIC; Rosacea; BACK PAIN, LOW; HEMATURIA, MICROSCOPIC, HX OF; and Chronic scapular pain on his problem list.  SUBJECTIVE  Chief complaint: Suture / Staple Removal   Patient here for suture removal.  Denies bleeding, pain, tenderness, and exudate from the wound.  Negative for nausea and fever.   He  has a past medical history of Posttraumatic compression fracture of lumbar vertebra (09/1977); Back injury (1 & 03/1984); Microhematuria (11/1985); and Benign positional vertigo (06/2004).    Medications reviewed and updated by myself where necessary, and exist elsewhere in the encounter.   [name] has No Known Allergies. He  reports that he has never smoked. He has never used smokeless tobacco. He reports that he drinks about 1.8 - 2.4 oz of alcohol per week. He reports that he does not use illicit drugs. He  has no sexual activity history on file. The patient  has past surgical history that includes LASIK (09/2005) and Nevus excision (07/2006).  His family history includes Hypertension in his mother.  Review of Systems  Constitutional: Negative for fever and chills.  Skin: Negative for itching and rash.    OBJECTIVE  His  height is 5\' 5"  (1.651 m) and weight is 163 lb (73.936 kg). His oral temperature is 98.2 F (36.8 C). His blood pressure is 110/68 and his pulse is 86. His respiration is 18 and oxygen saturation is 98%.  The patient's body mass index is 27.12 kg/(m^2).  Physical Exam  Vitals reviewed. Constitutional: He appears well-developed and well-nourished.  HENT:  Head:      No results found for this or any previous visit (from the past 24 hour(s)).  ASSESSMENT & PLAN  Sean Schwartz was seen today for suture / staple removal.  Diagnoses and all orders for this visit:  Facial laceration, subsequent encounter: Wound healed.   Sutures removed.  Pt to follow up prn for this problem.     The patient was advised to call or come back to clinic if he does not see an improvement in symptoms, or worsens with the above plan.   Sean Schwartz, MHS, PA-C [facility] 05/22/2015 8:28 AM

## 2015-11-14 ENCOUNTER — Ambulatory Visit (INDEPENDENT_AMBULATORY_CARE_PROVIDER_SITE_OTHER): Payer: BC Managed Care – PPO | Admitting: Internal Medicine

## 2015-11-14 VITALS — BP 120/62 | HR 78 | Temp 98.1°F | Resp 16 | Ht 65.0 in | Wt 160.4 lb

## 2015-11-14 DIAGNOSIS — L719 Rosacea, unspecified: Secondary | ICD-10-CM | POA: Diagnosis not present

## 2015-11-14 DIAGNOSIS — Z0000 Encounter for general adult medical examination without abnormal findings: Secondary | ICD-10-CM

## 2015-11-14 DIAGNOSIS — E785 Hyperlipidemia, unspecified: Secondary | ICD-10-CM

## 2015-11-14 DIAGNOSIS — Z1211 Encounter for screening for malignant neoplasm of colon: Secondary | ICD-10-CM | POA: Diagnosis not present

## 2015-11-14 DIAGNOSIS — I10 Essential (primary) hypertension: Secondary | ICD-10-CM | POA: Diagnosis not present

## 2015-11-14 DIAGNOSIS — Z1159 Encounter for screening for other viral diseases: Secondary | ICD-10-CM | POA: Diagnosis not present

## 2015-11-14 DIAGNOSIS — Z125 Encounter for screening for malignant neoplasm of prostate: Secondary | ICD-10-CM | POA: Diagnosis not present

## 2015-11-14 DIAGNOSIS — Z23 Encounter for immunization: Secondary | ICD-10-CM | POA: Diagnosis not present

## 2015-11-14 LAB — CBC WITH DIFFERENTIAL/PLATELET
Basophils Absolute: 0.1 10*3/uL (ref 0.0–0.1)
Basophils Relative: 2 % — ABNORMAL HIGH (ref 0–1)
Eosinophils Absolute: 0.1 10*3/uL (ref 0.0–0.7)
Eosinophils Relative: 3 % (ref 0–5)
HEMATOCRIT: 46.2 % (ref 39.0–52.0)
HEMOGLOBIN: 15.3 g/dL (ref 13.0–17.0)
LYMPHS ABS: 0.9 10*3/uL (ref 0.7–4.0)
LYMPHS PCT: 18 % (ref 12–46)
MCH: 31.1 pg (ref 26.0–34.0)
MCHC: 33.1 g/dL (ref 30.0–36.0)
MCV: 93.9 fL (ref 78.0–100.0)
MONOS PCT: 14 % — AB (ref 3–12)
MPV: 10.2 fL (ref 8.6–12.4)
Monocytes Absolute: 0.7 10*3/uL (ref 0.1–1.0)
NEUTROS ABS: 3 10*3/uL (ref 1.7–7.7)
NEUTROS PCT: 63 % (ref 43–77)
Platelets: 205 10*3/uL (ref 150–400)
RBC: 4.92 MIL/uL (ref 4.22–5.81)
RDW: 12.6 % (ref 11.5–15.5)
WBC: 4.8 10*3/uL (ref 4.0–10.5)

## 2015-11-14 LAB — COMPREHENSIVE METABOLIC PANEL
ALBUMIN: 4.5 g/dL (ref 3.6–5.1)
ALT: 35 U/L (ref 9–46)
AST: 27 U/L (ref 10–35)
Alkaline Phosphatase: 50 U/L (ref 40–115)
BUN: 13 mg/dL (ref 7–25)
CHLORIDE: 99 mmol/L (ref 98–110)
CO2: 24 mmol/L (ref 20–31)
CREATININE: 0.88 mg/dL (ref 0.70–1.25)
Calcium: 9.3 mg/dL (ref 8.6–10.3)
Glucose, Bld: 87 mg/dL (ref 65–99)
POTASSIUM: 4.6 mmol/L (ref 3.5–5.3)
SODIUM: 135 mmol/L (ref 135–146)
TOTAL PROTEIN: 7.2 g/dL (ref 6.1–8.1)
Total Bilirubin: 1.1 mg/dL (ref 0.2–1.2)

## 2015-11-14 LAB — LIPID PANEL
CHOLESTEROL: 186 mg/dL (ref 125–200)
HDL: 64 mg/dL (ref >=40)
LDL Cholesterol: 105 mg/dL (ref <130)
TRIGLYCERIDES: 85 mg/dL (ref <150)
Total CHOL/HDL Ratio: 2.9 Ratio (ref <=5.0)
VLDL: 17 mg/dL (ref <30)

## 2015-11-14 LAB — HEPATITIS C ANTIBODY: HCV Ab: NEGATIVE

## 2015-11-14 MED ORDER — SIMVASTATIN 80 MG PO TABS: 80.0000 mg | ORAL_TABLET | Freq: Every day | ORAL | Status: DC

## 2015-11-14 MED ORDER — LISINOPRIL-HYDROCHLOROTHIAZIDE 20-12.5 MG PO TABS: 1.0000 | ORAL_TABLET | Freq: Every day | ORAL | Status: DC
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
## 2015-11-14 NOTE — Patient Instructions (Signed)
Pneumococcal Vaccine, Polyvalent solution for injection What is this medicine? PNEUMOCOCCAL VACCINE, POLYVALENT (NEU mo KOK al vak SEEN, pol ee VEY luhnt) is a vaccine to prevent pneumococcus bacteria infection. These bacteria are a major cause of ear infections, Strep throat infections, and serious pneumonia, meningitis, or blood infections worldwide. These vaccines help the body to produce antibodies (protective substances) that help your body defend against these bacteria. This vaccine is recommended for people 64 years of age and older with health problems. It is also recommended for all adults over 64 years old. This vaccine will not treat an infection. This medicine may be used for other purposes; ask your health care provider or pharmacist if you have questions. What should I tell my health care provider before I take this medicine? They need to know if you have any of these conditions: -bleeding problems -bone marrow or organ transplant -cancer, Hodgkin's disease -fever -infection -immune system problems -low platelet count in the blood -seizures -an unusual or allergic reaction to pneumococcal vaccine, diphtheria toxoid, other vaccines, latex, other medicines, foods, dyes, or preservatives -pregnant or trying to get pregnant -breast-feeding How should I use this medicine? This vaccine is for injection into a muscle or under the skin. It is given by a health care professional. A copy of Vaccine Information Statements will be given before each vaccination. Read this sheet carefully each time. The sheet may change frequently. Talk to your pediatrician regarding the use of this medicine in children. While this drug may be prescribed for children as young as 64 years of age for selected conditions, precautions do apply. Overdosage: If you think you have taken too much of this medicine contact a poison control center or emergency room at once. NOTE: This medicine is only for you. Do not share  this medicine with others. What if I miss a dose? It is important not to miss your dose. Call your doctor or health care professional if you are unable to keep an appointment. What may interact with this medicine? -medicines for cancer chemotherapy -medicines that suppress your immune function -medicines that treat or prevent blood clots like warfarin, enoxaparin, and dalteparin -steroid medicines like prednisone or cortisone This list may not describe all possible interactions. Give your health care provider a list of all the medicines, herbs, non-prescription drugs, or dietary supplements you use. Also tell them if you smoke, drink alcohol, or use illegal drugs. Some items may interact with your medicine. What should I watch for while using this medicine? Mild fever and pain should go away in 3 days or less. Report any unusual symptoms to your doctor or health care professional. What side effects may I notice from receiving this medicine? Side effects that you should report to your doctor or health care professional as soon as possible: -allergic reactions like skin rash, itching or hives, swelling of the face, lips, or tongue -breathing problems -confused -fever over 102 degrees F -pain, tingling, numbness in the hands or feet -seizures -unusual bleeding or bruising -unusual muscle weakness Side effects that usually do not require medical attention (report to your doctor or health care professional if they continue or are bothersome): -aches and pains -diarrhea -fever of 102 degrees F or less -headache -irritable -loss of appetite -pain, tender at site where injected -trouble sleeping This list may not describe all possible side effects. Call your doctor for medical advice about side effects. You may report side effects to FDA at 1-800-FDA-1088. Where should I keep my medicine? This  does not apply. This vaccine is given in a clinic, pharmacy, doctor's office, or other health care  setting and will not be stored at home. NOTE: This sheet is a summary. It may not cover all possible information. If you have questions about this medicine, talk to your doctor, pharmacist, or health care provider.    2016, Elsevier/Gold Standard. (2008-07-19 14:32:37) Tdap Vaccine (Tetanus, Diphtheria and Pertussis): What You Need to Know 1. Why get vaccinated? Tetanus, diphtheria and pertussis are very serious diseases. Tdap vaccine can protect [place] from these diseases. And, Tdap vaccine given to pregnant women can protect newborn babies against pertussis. TETANUS (Lockjaw) is rare in the [place] today. It causes painful muscle tightening and stiffness, usually all over the body.  It can lead to tightening of muscles in the head and neck so you can't open your mouth, swallow, or sometimes even breathe. Tetanus kills about 1 out of 10 people who are infected even after receiving the best medical care. DIPHTHERIA is also rare in the [place] today. It can cause a thick coating to form in the back of the throat.  It can lead to breathing problems, heart failure, paralysis, and death. PERTUSSIS (Whooping Cough) causes severe coughing spells, which can cause difficulty breathing, vomiting and disturbed sleep.  It can also lead to weight loss, incontinence, and rib fractures. Up to 2 in 100 adolescents and 5 in 100 adults with pertussis are hospitalized or have complications, which could include pneumonia or death. These diseases are caused by bacteria. Diphtheria and pertussis are spread from person to person through secretions from coughing or sneezing. Tetanus enters the body through cuts, scratches, or wounds. Before vaccines, as many as 200,000 cases of diphtheria, 200,000 cases of pertussis, and hundreds of cases of tetanus, were reported in the [place] each year. Since vaccination began, reports of cases for tetanus and diphtheria have dropped by about 99% and for pertussis by  about 80%. 2. Tdap vaccine Tdap vaccine can protect adolescents and adults from tetanus, diphtheria, and pertussis. One dose of Tdap is routinely given at age 64 or 64. People who did not get Tdap at that age should get it as soon as possible. Tdap is especially important for healthcare professionals and anyone having close contact with a baby younger than 12 months. Pregnant women should get a dose of Tdap during every pregnancy, to protect the newborn from pertussis. Infants are most at risk for severe, life-threatening complications from pertussis. Another vaccine, called Td, protects against tetanus and diphtheria, but not pertussis. A Td booster should be given every 10 years. Tdap may be given as one of these boosters if you have never gotten Tdap before. Tdap may also be given after a severe cut or burn to prevent tetanus infection. Your doctor or the person giving you the vaccine can give you more information. Tdap may safely be given at the same time as other vaccines. 3. Some people should not get this vaccine  A person who has ever had a life-threatening allergic reaction after a previous dose of any diphtheria, tetanus or pertussis containing vaccine, OR has a severe allergy to any part of this vaccine, should not get Tdap vaccine. Tell the person giving the vaccine about any severe allergies.  Anyone who had coma or long repeated seizures within 7 days after a childhood dose of DTP or DTaP, or a previous dose of Tdap, should not get Tdap, unless a cause other than the vaccine was found.  They can still get Td.  Talk to your doctor if you:  have seizures or another nervous system problem,  had severe pain or swelling after any vaccine containing diphtheria, tetanus or pertussis,  ever had a condition called Guillain-Barr Syndrome (GBS),  aren't feeling well on the day the shot is scheduled. 4. Risks With any medicine, including vaccines, there is a chance of side effects. These  are usually mild and go away on their own. Serious reactions are also possible but are rare. Most people who get Tdap vaccine do not have any problems with it. Mild problems following Tdap (Did not interfere with activities)  Pain where the shot was given (about 3 in 4 adolescents or 2 in 3 adults)  Redness or swelling where the shot was given (about 1 person in 5)  Mild fever of at least 100.43F (up to about 1 in 25 adolescents or 1 in 100 adults)  Headache (about 3 or 4 people in 10)  Tiredness (about 1 person in 3 or 4)  Nausea, vomiting, diarrhea, stomach ache (up to 1 in 4 adolescents or 1 in 10 adults)  Chills, sore joints (about 1 person in 10)  Body aches (about 1 person in 3 or 4)  Rash, swollen glands (uncommon) Moderate problems following Tdap (Interfered with activities, but did not require medical attention)  Pain where the shot was given (up to 1 in 5 or 6)  Redness or swelling where the shot was given (up to about 1 in 16 adolescents or 1 in 12 adults)  Fever over 102F (about 1 in 100 adolescents or 1 in 250 adults)  Headache (about 1 in 7 adolescents or 1 in 10 adults)  Nausea, vomiting, diarrhea, stomach ache (up to 1 or 3 people in 100)  Swelling of the entire arm where the shot was given (up to about 1 in 500). Severe problems following Tdap (Unable to perform usual activities; required medical attention)  Swelling, severe pain, bleeding and redness in the arm where the shot was given (rare). Problems that could happen after any vaccine:  People sometimes faint after a medical procedure, including vaccination. Sitting or lying down for about 15 minutes can help prevent fainting, and injuries caused by a fall. Tell your doctor if you feel dizzy, or have vision changes or ringing in the ears.  Some people get severe pain in the shoulder and have difficulty moving the arm where a shot was given. This happens very rarely.  Any medication can cause a  severe allergic reaction. Such reactions from a vaccine are very rare, estimated at fewer than 1 in a million doses, and would happen within a few minutes to a few hours after the vaccination. As with any medicine, there is a very remote chance of a vaccine causing a serious injury or death. The safety of vaccines is always being monitored. For more information, visit: http://www.aguilar.org/ 5. What if there is a serious problem? What should I look for?  Look for anything that concerns you, such as signs of a severe allergic reaction, very high fever, or unusual behavior.  Signs of a severe allergic reaction can include hives, swelling of the face and throat, difficulty breathing, a fast heartbeat, dizziness, and weakness. These would usually start a few minutes to a few hours after the vaccination. What should I do?  If you think it is a severe allergic reaction or other emergency that can't wait, call 9-1-1 or get the person to the nearest  hospital. Otherwise, call your doctor.  Afterward, the reaction should be reported to the Vaccine Adverse Event Reporting System (VAERS). Your doctor might file this report, or you can do it yourself through the VAERS web site at www.vaers.SamedayNews.es, or by calling (530) 171-1784. VAERS does not give medical advice.  6. The National Vaccine Injury Compensation Program The [employer] Vaccine Injury Compensation Program (VICP) is a federal program that was created to compensate people who may have been injured by certain vaccines. Persons who believe they may have been injured by a vaccine can learn about the program and about filing a claim by calling 534-816-7990 or visiting the [place] website at GoldCloset.com.ee. There is a time limit to file a claim for compensation. 7. How can I learn more?  Ask your doctor. He or she can give you the vaccine package insert or suggest other sources of information.  Call your local or state health  department.  Contact the Centers for Disease Control and Prevention (CDC):  Call (807) 498-2413 (1-800-CDC-INFO) or  Visit CDC's website at http://hunter.com/ CDC Tdap Vaccine VIS (02/19/14)   This information is not intended to replace advice given to you by your health care provider. Make sure you discuss any questions you have with your health care provider.   Document Released: 06/13/2012 Document Revised: 01/03/2015 Document Reviewed: 03/27/2014 Elsevier Interactive Patient Education 2016 [place]. Influenza Virus Vaccine injection What is this medicine? INFLUENZA VIRUS VACCINE (in floo EN zuh VAHY ruhs vak SEEN) helps to reduce the risk of getting influenza also known as the flu. The vaccine only helps protect you against some strains of the flu. This medicine may be used for other purposes; ask your health care provider or pharmacist if you have questions. What should I tell my health care provider before I take this medicine? They need to know if you have any of these conditions: -bleeding disorder like hemophilia -fever or infection -Guillain-Barre syndrome or other neurological problems -immune system problems -infection with the human immunodeficiency virus (HIV) or AIDS -low blood platelet counts -multiple sclerosis -an unusual or allergic reaction to influenza virus vaccine, latex, other medicines, foods, dyes, or preservatives. Different brands of vaccines contain different allergens. Some may contain latex or eggs. Talk to your doctor about your allergies to make sure that you get the right vaccine. -pregnant or trying to get pregnant -breast-feeding How should I use this medicine? This vaccine is for injection into a muscle or under the skin. It is given by a health care professional. A copy of Vaccine Information Statements will be given before each vaccination. Read this sheet carefully each time. The sheet may change frequently. Talk to your healthcare  provider to see which vaccines are right for you. Some vaccines should not be used in all age groups. Overdosage: If you think you have taken too much of this medicine contact a poison control center or emergency room at once. NOTE: This medicine is only for you. Do not share this medicine with others. What if I miss a dose? This does not apply. What may interact with this medicine? -chemotherapy or radiation therapy -medicines that lower your immune system like etanercept, anakinra, infliximab, and adalimumab -medicines that treat or prevent blood clots like warfarin -phenytoin -steroid medicines like prednisone or cortisone -theophylline -vaccines This list may not describe all possible interactions. Give your health care provider a list of all the medicines, herbs, non-prescription drugs, or dietary supplements you use. Also tell them if you smoke, drink alcohol, or use  illegal drugs. Some items may interact with your medicine. What should I watch for while using this medicine? Report any side effects that do not go away within 3 days to your doctor or health care professional. Call your health care provider if any unusual symptoms occur within 6 weeks of receiving this vaccine. You may still catch the flu, but the illness is not usually as bad. You cannot get the flu from the vaccine. The vaccine will not protect against colds or other illnesses that may cause fever. The vaccine is needed every year. What side effects may I notice from receiving this medicine? Side effects that you should report to your doctor or health care professional as soon as possible: -allergic reactions like skin rash, itching or hives, swelling of the face, lips, or tongue Side effects that usually do not require medical attention (report to your doctor or health care professional if they continue or are bothersome): -fever -headache -muscle aches and pains -pain, tenderness, redness, or swelling at the injection  site -tiredness This list may not describe all possible side effects. Call your doctor for medical advice about side effects. You may report side effects to FDA at 1-800-FDA-1088. Where should I keep my medicine? The vaccine will be given by a health care professional in a clinic, pharmacy, doctor's office, or other health care setting. You will not be given vaccine doses to store at home. NOTE: This sheet is a summary. It may not cover all possible information. If you have questions about this medicine, talk to your doctor, pharmacist, or health care provider.    2016, Elsevier/Gold Standard. (2015-07-04 10:07:28)

## 2015-11-15 LAB — PSA: PSA: 0.03 ng/mL (ref <=4.00)

## 2015-11-17 ENCOUNTER — Encounter: Payer: Self-pay | Admitting: Gastroenterology

## 2015-11-17 ENCOUNTER — Encounter: Payer: Self-pay | Admitting: Internal Medicine

## 2016-01-06 ENCOUNTER — Ambulatory Visit (AMBULATORY_SURGERY_CENTER): Payer: Self-pay

## 2016-01-06 VITALS — Ht 65.75 in | Wt 167.2 lb

## 2016-01-06 DIAGNOSIS — Z1211 Encounter for screening for malignant neoplasm of colon: Secondary | ICD-10-CM

## 2016-01-06 MED ORDER — SUPREP BOWEL PREP KIT 17.5-3.13-1.6 GM/177ML PO SOLN: 1.0000 | Freq: Once | ORAL | Status: DC

## 2016-01-06 NOTE — Progress Notes (Signed)
No allergies to eggs or soy No diet/weight loss meds No home oxygen No past problems with anesthesia  Has email and internet; registered for emmi

## 2016-01-15 ENCOUNTER — Encounter: Payer: BC Managed Care – PPO | Admitting: Gastroenterology

## 2016-02-11 ENCOUNTER — Encounter: Payer: BC Managed Care – PPO | Admitting: Gastroenterology

## 2016-02-23 ENCOUNTER — Other Ambulatory Visit: Payer: Self-pay | Admitting: Family Medicine

## 2016-02-23 ENCOUNTER — Other Ambulatory Visit: Payer: Self-pay

## 2016-02-23 MED ORDER — DOXYCYCLINE HYCLATE 50 MG PO CAPS: 50.0000 mg | ORAL_CAPSULE | Freq: Every day | ORAL | Status: DC

## 2016-02-23 NOTE — Telephone Encounter (Signed)
Pt called because his pharm req for RF of doxycycline was refused by [place]. Dr Laney Pastor, it looks like Dr L had Rxd this for him in the past for a year of RFs, but he has not seen pt recently. You saw pt in Oct for CPE and discussed rosacea, but your notes says pt was being seen by dermatologist. Since pt is now requesting for [place] to Rx it, do you want to approve RFs?

## 2016-02-23 NOTE — Telephone Encounter (Signed)
Okay to continue treatment started by Dr. for rosacea L

## 2016-02-24 NOTE — Telephone Encounter (Signed)
Notified pt that Dr Laney Pastor approved RF req for doxy sent by pharm.

## 2016-03-11 ENCOUNTER — Ambulatory Visit (AMBULATORY_SURGERY_CENTER): Payer: Medicare Other | Admitting: Gastroenterology

## 2016-03-11 ENCOUNTER — Encounter: Payer: Self-pay | Admitting: Gastroenterology

## 2016-03-11 VITALS — BP 125/77 | HR 68 | Temp 99.5°F | Resp 15 | Ht 65.0 in | Wt 167.0 lb

## 2016-03-11 DIAGNOSIS — Z1211 Encounter for screening for malignant neoplasm of colon: Secondary | ICD-10-CM

## 2016-03-11 MED ORDER — SODIUM CHLORIDE 0.9 % IV SOLN: 500.0000 mL | INTRAVENOUS | Status: DC

## 2016-03-11 NOTE — Progress Notes (Signed)
A ando x3  Report to [employer]

## 2016-03-11 NOTE — Patient Instructions (Addendum)
YOU HAD AN ENDOSCOPIC PROCEDURE TODAY AT [facility]:   Refer to the procedure report that was given to you for any specific questions about what was found during the examination.  If the procedure report does not answer your questions, please call your gastroenterologist to clarify.  If you requested that your care partner not be given the details of your procedure findings, then the procedure report has been included in a sealed envelope for you to review at your convenience later.  YOU SHOULD EXPECT: Some feelings of bloating in the abdomen. Passage of more gas than usual.  Walking can help get rid of the air that was put into your GI tract during the procedure and reduce the bloating. If you had a lower endoscopy (such as a colonoscopy or flexible sigmoidoscopy) you may notice spotting of blood in your stool or on the toilet paper. If you underwent a bowel prep for your procedure, you may not have a normal bowel movement for a few days.  Please Note:  You might notice some irritation and congestion in your nose or some drainage.  This is from the oxygen used during your procedure.  There is no need for concern and it should clear up in a day or so.  SYMPTOMS TO REPORT IMMEDIATELY:   Following lower endoscopy (colonoscopy or flexible sigmoidoscopy):  Excessive amounts of blood in the stool  Significant tenderness or worsening of abdominal pains  Swelling of the abdomen that is new, acute  Fever of 100F or higher   For urgent or emergent issues, a gastroenterologist can be reached at any hour by calling [phone].   DIET: Your first meal following the procedure should be a small meal and then it is ok to progress to your normal diet. Heavy or fried foods are harder to digest and may make you feel nauseous or bloated.  Likewise, meals heavy in dairy and vegetables can increase bloating.  Drink plenty of fluids but you should avoid alcoholic beverages for 24  hours.  ACTIVITY:  You should plan to take it easy for the rest of today and you should NOT DRIVE or use heavy machinery until tomorrow (because of the sedation medicines used during the test).    FOLLOW UP: Our staff will call the number listed on your records the next business day following your procedure to check on you and address any questions or concerns that you may have regarding the information given to you following your procedure. If we do not reach you, we will leave a message.  However, if you are feeling well and you are not experiencing any problems, there is no need to return our call.  We will assume that you have returned to your regular daily activities without incident.  If any biopsies were taken you will be contacted by phone or by letter within the next 1-3 weeks.  Please call us at [phone] if you have not heard about the biopsies in 3 weeks.    SIGNATURES/CONFIDENTIALITY: You and/or your care partner have signed paperwork which will be entered into your electronic medical record.  These signatures attest to the fact that that the information above on your After Visit Summary has been reviewed and is understood.  Full responsibility of the confidentiality of this discharge information lies with you and/or your care-partner.  Hemorrhoid, diverticulosis,and high fiber diet information given.  Recall colonoscopy 10 years-2027

## 2016-03-11 NOTE — Op Note (Signed)
[facility] Patient Name: Sean Schwartz Procedure Date: 03/11/2016 2:31 PM MRN: [id number] Endoscopist: Mauri Pole , MD Age: 65 Referring MD:  Date of Birth: 1951-03-18 Gender: Male Procedure:                Colonoscopy Indications:              Screening for colorectal malignant neoplasm Medicines:                Propofol total dose 250 mg IV, Monitored Anesthesia                            Care Procedure:                Pre-Anesthesia Assessment:                           - Prior to the procedure, a History and Physical                            was performed, and patient medications and                            allergies were reviewed. The patient's tolerance of                            previous anesthesia was also reviewed. The risks                            and benefits of the procedure and the sedation                            options and risks were discussed with the patient.                            All questions were answered, and informed consent                            was obtained. Prior Anticoagulants: The patient has                            taken no previous anticoagulant or antiplatelet                            agents. ASA Grade Assessment: II - A patient with                            mild systemic disease. After reviewing the risks                            and benefits, the patient was deemed in                            satisfactory condition to undergo the procedure.  After obtaining informed consent, the colonoscope                            was passed under direct vision. Throughout the                            procedure, the patient's blood pressure, pulse, and                            oxygen saturations were monitored continuously. The                            Model CF-HQ190L [id number]) scope was introduced                            through the anus and advanced to the the cecum,                            identified by appendiceal orifice and ileocecal                            valve. The colonoscopy was performed without                            difficulty. The patient tolerated the procedure                            well. The quality of the bowel preparation was                            good. The ileocecal valve, appendiceal orifice, and                            rectum were photographed. Scope In: 2:37:48 PM Scope Out: 2:53:45 PM Scope Withdrawal Time: 0 hours 9 minutes 13 seconds  Total Procedure Duration: 0 hours 15 minutes 57 seconds  Findings:      The perianal and digital rectal examinations were normal.      Non-bleeding internal hemorrhoids were found during retroflexion. The       hemorrhoids were small.      The exam was otherwise without abnormality on direct and retroflexion       views.      A few small-mouthed diverticula were found in the sigmoid colon. Complications:            No immediate complications. Estimated Blood Loss:     Estimated blood loss: none. Impression:               - Non-bleeding internal hemorrhoids.                           - The examination was otherwise normal on direct                            and retroflexion views.                           -  Diverticulosis in the sigmoid colon.                           - No specimens collected. Recommendation:           - Patient has a contact number available for                            emergencies. The signs and symptoms of potential                            delayed complications were discussed with the                            patient. Return to normal activities tomorrow.                            Written discharge instructions were provided to the                            patient.                           - Resume previous diet.                           - Continue present medications.                           - Repeat colonoscopy in 10 years for  screening                            purposes.                           - Return to GI clinic PRN. Procedure Code(s):        --- Professional ---                           [id number], Colorectal cancer screening; colonoscopy on                            individual not meeting criteria for high risk CPT copyright 2016 [employer]. All rights reserved. Mauri Pole, MD 03/11/2016 3:01:29 PM This report has been signed electronically. Number of Addenda: 0

## 2016-03-12 ENCOUNTER — Telehealth: Payer: Self-pay

## 2016-03-12 NOTE — Telephone Encounter (Signed)
  Follow up Call-  Call back number 03/11/2016  Post procedure Call Back phone  # [phone] cell  Permission to leave phone message Yes     Patient questions:  Do you have a fever, pain , or abdominal swelling? No. Pain Score  0 *  Have you tolerated food without any problems? Yes.    Have you been able to return to your normal activities? Yes.    Do you have any questions about your discharge instructions: Diet   No. Medications  No. Follow up visit  No.  Do you have questions or concerns about your Care? No.  Actions: * If pain score is 4 or above: No action needed, pain <4.

## 2016-08-25 ENCOUNTER — Ambulatory Visit (INDEPENDENT_AMBULATORY_CARE_PROVIDER_SITE_OTHER): Payer: Medicare Other | Admitting: Family Medicine

## 2016-08-25 VITALS — BP 112/78 | HR 92 | Temp 97.9°F | Resp 17 | Ht 65.0 in | Wt 164.0 lb

## 2016-08-25 DIAGNOSIS — R110 Nausea: Secondary | ICD-10-CM | POA: Diagnosis not present

## 2016-08-25 DIAGNOSIS — D696 Thrombocytopenia, unspecified: Secondary | ICD-10-CM | POA: Diagnosis not present

## 2016-08-25 DIAGNOSIS — K5732 Diverticulitis of large intestine without perforation or abscess without bleeding: Secondary | ICD-10-CM | POA: Diagnosis not present

## 2016-08-25 DIAGNOSIS — R1084 Generalized abdominal pain: Secondary | ICD-10-CM | POA: Diagnosis not present

## 2016-08-25 LAB — POC MICROSCOPIC URINALYSIS (UMFC): MUCUS RE: ABSENT

## 2016-08-25 LAB — POCT URINALYSIS DIP (MANUAL ENTRY)
BILIRUBIN UA: NEGATIVE
Bilirubin, UA: NEGATIVE
GLUCOSE UA: NEGATIVE
LEUKOCYTES UA: NEGATIVE
Nitrite, UA: NEGATIVE
Spec Grav, UA: 1.01
UROBILINOGEN UA: 0.2
pH, UA: 5.5

## 2016-08-25 LAB — POCT CBC
GRANULOCYTE PERCENT: 88.6 % — AB (ref 37–80)
HEMATOCRIT: 46.4 % (ref 43.5–53.7)
Hemoglobin: 16.5 g/dL (ref 14.1–18.1)
LYMPH, POC: 0.6 (ref 0.6–3.4)
MCH, POC: 32.4 pg — AB (ref 27–31.2)
MCHC: 35.5 g/dL — AB (ref 31.8–35.4)
MCV: 91.1 fL (ref 80–97)
MID (CBC): 0.1 (ref 0–0.9)
MPV: 8.1 fL (ref 0–99.8)
POC GRANULOCYTE: 5.1 (ref 2–6.9)
POC LYMPH %: 10.2 % (ref 10–50)
POC MID %: 1.2 %M (ref 0–12)
Platelet Count, POC: 115 10*3/uL — AB (ref 142–424)
RBC: 5.09 M/uL (ref 4.69–6.13)
RDW, POC: 12.9 %
WBC: 5.8 10*3/uL (ref 4.6–10.2)

## 2016-08-25 MED ORDER — ONDANSETRON 4 MG PO TBDP: 4.0000 mg | ORAL_TABLET | Freq: Three times a day (TID) | ORAL | 0 refills | Status: DC | PRN

## 2016-08-25 NOTE — Patient Instructions (Addendum)
Your blood counts are overall reassuring. Infection fighting cells were overall in the normal range, but platelets were slightly low. You may have a viral infection, or less likely early intestinal infection. Recheck here either tomorrow or Friday to repeat your blood count and exam to determine if CT scanning as needed. I did prescribe Zofran if needed for nausea, but for now fluids are more important than solid foods. See other information on abdominal pain and diarrhea below. Return to the clinic or go to the nearest emergency room if any of your symptoms worsen or new symptoms occur.   Abdominal Pain, Adult Many things can cause abdominal pain. Usually, abdominal pain is not caused by a disease and will improve without treatment. It can often be observed and treated at home. Your health care provider will do a physical exam and possibly order blood tests and X-rays to help determine the seriousness of your pain. However, in many cases, more time must pass before a clear cause of the pain can be found. Before that point, your health care provider may not know if you need more testing or further treatment. HOME CARE INSTRUCTIONS Monitor your abdominal pain for any changes. The following actions may help to alleviate any discomfort you are experiencing:  Only take over-the-counter or prescription medicines as directed by your health care provider.  Do not take laxatives unless directed to do so by your health care provider.  Try a clear liquid diet (broth, tea, or water) as directed by your health care provider. Slowly move to a bland diet as tolerated. SEEK MEDICAL CARE IF:  You have unexplained abdominal pain.  You have abdominal pain associated with nausea or diarrhea.  You have pain when you urinate or have a bowel movement.  You experience abdominal pain that wakes you in the night.  You have abdominal pain that is worsened or improved by eating food.  You have abdominal pain that is  worsened with eating fatty foods.  You have a fever. SEEK IMMEDIATE MEDICAL CARE IF:  Your pain does not go away within 2 hours.  You keep throwing up (vomiting).  Your pain is felt only in portions of the abdomen, such as the right side or the left lower portion of the abdomen.  You pass bloody or black tarry stools. MAKE SURE YOU:  Understand these instructions.  Will watch your condition.  Will get help right away if you are not doing well or get worse.   This information is not intended to replace advice given to you by your health care provider. Make sure you discuss any questions you have with your health care provider.   Document Released: 09/22/2005 Document Revised: 09/03/2015 Document Reviewed: 08/22/2013 Elsevier Interactive Patient Education 2016 [place].  Diarrhea Diarrhea is frequent loose and watery bowel movements. It can cause you to feel weak and dehydrated. Dehydration can cause you to become tired and thirsty, have a dry mouth, and have decreased urination that often is dark yellow. Diarrhea is a sign of another problem, most often an infection that will not last long. In most cases, diarrhea typically lasts 2-3 days. However, it can last longer if it is a sign of something more serious. It is important to treat your diarrhea as directed by your caregiver to lessen or prevent future episodes of diarrhea. CAUSES  Some common causes include:  Gastrointestinal infections caused by viruses, bacteria, or parasites.  Food poisoning or food allergies.  Certain medicines, such as antibiotics, chemotherapy,  and laxatives.  Artificial sweeteners and fructose.  Digestive disorders. HOME CARE INSTRUCTIONS  Ensure adequate fluid intake (hydration): Have 1 cup (8 oz) of fluid for each diarrhea episode. Avoid fluids that contain simple sugars or sports drinks, fruit juices, whole milk products, and sodas. Your urine should be clear or pale yellow if you are  drinking enough fluids. Hydrate with an oral rehydration solution that you can purchase at pharmacies, retail stores, and online. You can prepare an oral rehydration solution at home by mixing the following ingredients together:   - tsp table salt.   tsp baking soda.   tsp salt substitute containing potassium chloride.  1  tablespoons sugar.  1 L (34 oz) of water.  Certain foods and beverages may increase the speed at which food moves through the gastrointestinal (GI) tract. These foods and beverages should be avoided and include:  Caffeinated and alcoholic beverages.  High-fiber foods, such as raw fruits and vegetables, nuts, seeds, and whole grain breads and cereals.  Foods and beverages sweetened with sugar alcohols, such as xylitol, sorbitol, and mannitol.  Some foods may be well tolerated and may help thicken stool including:  Starchy foods, such as rice, toast, pasta, low-sugar cereal, oatmeal, grits, baked potatoes, crackers, and bagels.  Bananas.  Applesauce.  Add probiotic-rich foods to help increase healthy bacteria in the GI tract, such as yogurt and fermented milk products.  Wash your hands well after each diarrhea episode.  Only take over-the-counter or prescription medicines as directed by your caregiver.  Take a warm bath to relieve any burning or pain from frequent diarrhea episodes. SEEK IMMEDIATE MEDICAL CARE IF:   You are unable to keep fluids down.  You have persistent vomiting.  You have blood in your stool, or your stools are black and tarry.  You do not urinate in 6-8 hours, or there is only a small amount of very dark urine.  You have abdominal pain that increases or localizes.  You have weakness, dizziness, confusion, or light-headedness.  You have a severe headache.  Your diarrhea gets worse or does not get better.  You have a fever or persistent symptoms for more than 2-3 days.  You have a fever and your symptoms suddenly get  worse. MAKE SURE YOU:   Understand these instructions.  Will watch your condition.  Will get help right away if you are not doing well or get worse.   This information is not intended to replace advice given to you by your health care provider. Make sure you discuss any questions you have with your health care provider.   Document Released: 12/03/2002 Document Revised: 01/03/2015 Document Reviewed: 08/20/2012 Elsevier Interactive Patient Education 2016 [employer].    IF you received an x-ray today, you will receive an invoice from [facility]. Please contact [facility] at [phone] with questions or concerns regarding your invoice.   IF you received labwork today, you will receive an invoice from [employer]. Please contact [facility] at [phone] with questions or concerns regarding your invoice.   Our billing staff will not be able to assist you with questions regarding bills from these companies.  You will be contacted with the lab results as soon as they are available. The fastest way to get your results is to activate your My Chart account. Instructions are located on the last page of this paperwork. If you have not heard from [place] regarding the results in 2 weeks, please contact this office.
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
## 2016-08-27 ENCOUNTER — Ambulatory Visit (INDEPENDENT_AMBULATORY_CARE_PROVIDER_SITE_OTHER): Payer: Medicare Other | Admitting: Family Medicine

## 2016-08-27 VITALS — BP 98/58 | HR 71 | Temp 97.5°F | Resp 18 | Ht 65.0 in | Wt 161.2 lb

## 2016-08-27 DIAGNOSIS — M791 Myalgia, unspecified site: Secondary | ICD-10-CM

## 2016-08-27 DIAGNOSIS — R197 Diarrhea, unspecified: Secondary | ICD-10-CM

## 2016-08-27 DIAGNOSIS — R509 Fever, unspecified: Secondary | ICD-10-CM | POA: Diagnosis not present

## 2016-08-27 DIAGNOSIS — D696 Thrombocytopenia, unspecified: Secondary | ICD-10-CM | POA: Diagnosis not present

## 2016-08-27 DIAGNOSIS — R110 Nausea: Secondary | ICD-10-CM

## 2016-08-27 LAB — POCT CBC
GRANULOCYTE PERCENT: 60.9 % (ref 37–80)
HEMATOCRIT: 45.3 % (ref 43.5–53.7)
HEMOGLOBIN: 16.1 g/dL (ref 14.1–18.1)
Lymph, poc: 0.8 (ref 0.6–3.4)
MCH, POC: 32.5 pg — AB (ref 27–31.2)
MCHC: 35.6 g/dL — AB (ref 31.8–35.4)
MCV: 91.3 fL (ref 80–97)
MID (cbc): 0.4 (ref 0–0.9)
MPV: 8.2 fL (ref 0–99.8)
POC GRANULOCYTE: 1.8 — AB (ref 2–6.9)
POC LYMPH PERCENT: 26 %L (ref 10–50)
POC MID %: 13.1 %M — AB (ref 0–12)
Platelet Count, POC: 93 10*3/uL — AB (ref 142–424)
RBC: 4.97 M/uL (ref 4.69–6.13)
RDW, POC: 13.1 %
WBC: 3 10*3/uL — AB (ref 4.6–10.2)

## 2016-08-27 LAB — COMPLETE METABOLIC PANEL WITH GFR
ALT: 41 U/L (ref 9–46)
AST: 36 U/L — ABNORMAL HIGH (ref 10–35)
Albumin: 3.7 g/dL (ref 3.6–5.1)
Alkaline Phosphatase: 44 U/L (ref 40–115)
BILIRUBIN TOTAL: 0.5 mg/dL (ref 0.2–1.2)
BUN: 19 mg/dL (ref 7–25)
CHLORIDE: 97 mmol/L — AB (ref 98–110)
CO2: 26 mmol/L (ref 20–31)
Calcium: 8.6 mg/dL (ref 8.6–10.3)
Creat: 1.02 mg/dL (ref 0.70–1.25)
GFR, EST AFRICAN AMERICAN: 89 mL/min (ref >=60)
GFR, EST NON AFRICAN AMERICAN: 77 mL/min (ref >=60)
Glucose, Bld: 88 mg/dL (ref 65–99)
Potassium: 4.4 mmol/L (ref 3.5–5.3)
Sodium: 131 mmol/L — ABNORMAL LOW (ref 135–146)
Total Protein: 6.1 g/dL (ref 6.1–8.1)

## 2016-08-27 LAB — CK: Total CK: 54 U/L (ref 7–232)

## 2016-08-27 LAB — LIPASE: LIPASE: 29 U/L (ref 7–60)

## 2016-08-27 NOTE — Patient Instructions (Addendum)
I will check other tests for possible viral infection or other intestinal infection, as well as your liver and pancreas. No new medications or antibiotics needed at this time. Make sure you're drinking plenty of fluids, rest, and avoid solid foods for the next day or 2 until symptoms have improved again, then slowly start with bland foods such as soup. If any worsening into next week, return here or the emergency room.  Diarrhea Diarrhea is frequent loose and watery bowel movements. It can cause you to feel weak and dehydrated. Dehydration can cause you to become tired and thirsty, have a dry mouth, and have decreased urination that often is dark yellow. Diarrhea is a sign of another problem, most often an infection that will not last long. In most cases, diarrhea typically lasts 2-3 days. However, it can last longer if it is a sign of something more serious. It is important to treat your diarrhea as directed by your caregiver to lessen or prevent future episodes of diarrhea. CAUSES  Some common causes include:  Gastrointestinal infections caused by viruses, bacteria, or parasites.  Food poisoning or food allergies.  Certain medicines, such as antibiotics, chemotherapy, and laxatives.  Artificial sweeteners and fructose.  Digestive disorders. HOME CARE INSTRUCTIONS  Ensure adequate fluid intake (hydration): Have 1 cup (8 oz) of fluid for each diarrhea episode. Avoid fluids that contain simple sugars or sports drinks, fruit juices, whole milk products, and sodas. Your urine should be clear or pale yellow if you are drinking enough fluids. Hydrate with an oral rehydration solution that you can purchase at pharmacies, retail stores, and online. You can prepare an oral rehydration solution at home by mixing the following ingredients together:   - tsp table salt.   tsp baking soda.   tsp salt substitute containing potassium chloride.  1  tablespoons sugar.  1 L (34 oz) of water.  Certain  foods and beverages may increase the speed at which food moves through the gastrointestinal (GI) tract. These foods and beverages should be avoided and include:  Caffeinated and alcoholic beverages.  High-fiber foods, such as raw fruits and vegetables, nuts, seeds, and whole grain breads and cereals.  Foods and beverages sweetened with sugar alcohols, such as xylitol, sorbitol, and mannitol.  Some foods may be well tolerated and may help thicken stool including:  Starchy foods, such as rice, toast, pasta, low-sugar cereal, oatmeal, grits, baked potatoes, crackers, and bagels.  Bananas.  Applesauce.  Add probiotic-rich foods to help increase healthy bacteria in the GI tract, such as yogurt and fermented milk products.  Wash your hands well after each diarrhea episode.  Only take over-the-counter or prescription medicines as directed by your caregiver.  Take a warm bath to relieve any burning or pain from frequent diarrhea episodes. SEEK IMMEDIATE MEDICAL CARE IF:   You are unable to keep fluids down.  You have persistent vomiting.  You have blood in your stool, or your stools are black and tarry.  You do not urinate in 6-8 hours, or there is only a small amount of very dark urine.  You have abdominal pain that increases or localizes.  You have weakness, dizziness, confusion, or light-headedness.  You have a severe headache.  Your diarrhea gets worse or does not get better.  You have a fever or persistent symptoms for more than 2-3 days.  You have a fever and your symptoms suddenly get worse. MAKE SURE YOU:   Understand these instructions.  Will watch your condition.  Will  get help right away if you are not doing well or get worse.   This information is not intended to replace advice given to you by your health care provider. Make sure you discuss any questions you have with your health care provider.   Document Released: 12/03/2002 Document Revised: 01/03/2015  Document Reviewed: 08/20/2012 Elsevier Interactive Patient Education 2016 [employer].   IF you received an x-ray today, you will receive an invoice from [facility]. Please contact [facility] at [phone] with questions or concerns regarding your invoice.   IF you received labwork today, you will receive an invoice from [employer]. Please contact [facility] at [phone] with questions or concerns regarding your invoice.   Our billing staff will not be able to assist you with questions regarding bills from these companies.  You will be contacted with the lab results as soon as they are available. The fastest way to get your results is to activate your My Chart account. Instructions are located on the last page of this paperwork. If you have not heard from [place] regarding the results in 2 weeks, please contact this office.
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
## 2016-08-31 ENCOUNTER — Ambulatory Visit: Payer: Medicare Other

## 2016-08-31 LAB — GASTROINTESTINAL PATHOGEN PANEL PCR
C. DIFFICILE TOX A/B, PCR: NOT DETECTED
CAMPYLOBACTER, PCR: NOT DETECTED
Cryptosporidium, PCR: NOT DETECTED
E COLI (ETEC) LT/ST, PCR: NOT DETECTED
E COLI 0157, PCR: NOT DETECTED
E coli (STEC) stx1/stx2, PCR: NOT DETECTED
Giardia lamblia, PCR: NOT DETECTED
Norovirus, PCR: NOT DETECTED
Rotavirus A, PCR: NOT DETECTED
SALMONELLA, PCR: NOT DETECTED
Shigella, PCR: NOT DETECTED

## 2016-11-15 ENCOUNTER — Other Ambulatory Visit: Payer: Self-pay | Admitting: *Deleted

## 2016-11-15 DIAGNOSIS — I10 Essential (primary) hypertension: Secondary | ICD-10-CM

## 2016-11-15 MED ORDER — LISINOPRIL-HYDROCHLOROTHIAZIDE 20-12.5 MG PO TABS: 1.0000 | ORAL_TABLET | Freq: Every day | ORAL | 0 refills | Status: DC

## 2016-12-01 ENCOUNTER — Encounter: Payer: Self-pay | Admitting: Family Medicine

## 2016-12-01 ENCOUNTER — Ambulatory Visit (INDEPENDENT_AMBULATORY_CARE_PROVIDER_SITE_OTHER): Payer: Medicare Other | Admitting: Family Medicine

## 2016-12-01 VITALS — BP 128/80 | HR 93 | Temp 98.4°F | Resp 16 | Ht 65.0 in | Wt 163.4 lb

## 2016-12-01 DIAGNOSIS — Z125 Encounter for screening for malignant neoplasm of prostate: Secondary | ICD-10-CM | POA: Diagnosis not present

## 2016-12-01 DIAGNOSIS — I10 Essential (primary) hypertension: Secondary | ICD-10-CM | POA: Diagnosis not present

## 2016-12-01 DIAGNOSIS — Z23 Encounter for immunization: Secondary | ICD-10-CM

## 2016-12-01 DIAGNOSIS — E7800 Pure hypercholesterolemia, unspecified: Secondary | ICD-10-CM | POA: Diagnosis not present

## 2016-12-01 DIAGNOSIS — Z114 Encounter for screening for human immunodeficiency virus [HIV]: Secondary | ICD-10-CM

## 2016-12-01 DIAGNOSIS — Z0000 Encounter for general adult medical examination without abnormal findings: Secondary | ICD-10-CM | POA: Diagnosis not present

## 2016-12-01 NOTE — Patient Instructions (Addendum)
   IF you received an x-ray today, you will receive an invoice from [facility]. Please contact [facility] at [phone] with questions or concerns regarding your invoice.   IF you received labwork today, you will receive an invoice from [facility]. Please contact [facility] at [phone] with questions or concerns regarding your invoice.   Our billing staff will not be able to assist you with questions regarding bills from these companies.  You will be contacted with the lab results as soon as they are available. The fastest way to get your results is to activate your My Chart account. Instructions are located on the last page of this paperwork. If you have not heard from us regarding the results in 2 weeks, please contact this office.    Keeping you healthy  Get these tests  Blood pressure- Have your blood pressure checked once a year by your healthcare provider.  Normal blood pressure is 120/80  Weight- Have your body mass index (BMI) calculated to screen for obesity.  BMI is a measure of body fat based on height and weight. You can also calculate your own BMI at www.[place]hl[place].com/bmi/.  Cholesterol- Have your cholesterol checked every year.  Diabetes- Have your blood sugar checked regularly if you have high blood pressure, high cholesterol, have a family history of diabetes or if you are overweight.  Screening for Colon Cancer- Colonoscopy starting at age 50.  Screening may begin sooner depending on your family history and other health conditions. Follow up colonoscopy as directed by your Gastroenterologist.  Screening for Prostate Cancer- Both blood work (PSA) and a rectal exam help screen for Prostate Cancer.  Screening begins at age 40 with African-American men and at age 50 with Caucasian men.  Screening may begin sooner depending on your family history.  Take these medicines  Aspirin- One aspirin daily can help prevent Heart  disease and Stroke.  Flu shot- Every fall.  Tetanus- Every 10 years.  Zostavax- Once after the age of 60 to prevent Shingles.  Pneumonia shot- Once after the age of 65; if you are younger than 65, ask your healthcare provider if you need a Pneumonia shot.  Take these steps  Don't smoke- If you do smoke, talk to your doctor about quitting.  For tips on how to quit, go to www.smokefree.gov or call 1-800-QUIT-NOW.  Be physically active- Exercise 5 days a week for at least 30 minutes.  If you are not already physically active start slow and gradually work up to 30 minutes of moderate physical activity.  Examples of moderate activity include walking briskly, mowing the yard, dancing, swimming, bicycling, etc.  Eat a healthy diet- Eat a variety of healthy food such as fruits, vegetables, low fat milk, low fat cheese, yogurt, lean meant, poultry, fish, beans, tofu, etc. For more information go to www.thenutritionsource.org  Drink alcohol in moderation- Limit alcohol intake to less than two drinks a day. Never drink and drive.  Dentist- Brush and floss twice daily; visit your dentist twice a year.  Depression- Your emotional health is as important as your physical health. If you're feeling down, or losing interest in things you would normally enjoy please talk to your healthcare provider.  Eye exam- Visit your eye doctor every year.  Safe sex- If you may be exposed to a sexually transmitted infection, use a condom.  Seat belts- Seat belts can save your life; always wear one.  Smoke/Carbon Monoxide detectors- These detectors need to be installed on   the appropriate level of your home.  Replace batteries at least once a year.  Skin cancer- When out in the sun, cover up and use sunscreen 15 SPF or higher.  Violence- If anyone is threatening you, please tell your healthcare provider.  Living Will/ Health care power of attorney- Speak with your healthcare provider and family. 
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
## 2016-12-02 LAB — COMPREHENSIVE METABOLIC PANEL
A/G RATIO: 1.6 (ref 1.2–2.2)
ALK PHOS: 47 IU/L (ref 39–117)
ALT: 44 IU/L (ref 0–44)
AST: 29 IU/L (ref 0–40)
Albumin: 4.8 g/dL (ref 3.6–4.8)
BILIRUBIN TOTAL: 1 mg/dL (ref 0.0–1.2)
BUN/Creatinine Ratio: 23 (ref 10–24)
BUN: 21 mg/dL (ref 8–27)
CALCIUM: 9.8 mg/dL (ref 8.6–10.2)
CHLORIDE: 93 mmol/L — AB (ref 96–106)
CO2: 22 mmol/L (ref 18–29)
Creatinine, Ser: 0.9 mg/dL (ref 0.76–1.27)
GFR calc non Af Amer: 89 mL/min/{1.73_m2} (ref >59)
GFR, EST AFRICAN AMERICAN: 103 mL/min/{1.73_m2} (ref >59)
Globulin, Total: 3 g/dL (ref 1.5–4.5)
Glucose: 92 mg/dL (ref 65–99)
POTASSIUM: 4.5 mmol/L (ref 3.5–5.2)
Sodium: 135 mmol/L (ref 134–144)
Total Protein: 7.8 g/dL (ref 6.0–8.5)

## 2016-12-02 LAB — CBC WITH DIFFERENTIAL/PLATELET
Basophils Absolute: 0.1 10*3/uL (ref 0.0–0.2)
Basos: 2 %
EOS (ABSOLUTE): 0.2 10*3/uL (ref 0.0–0.4)
EOS: 3 %
HEMATOCRIT: 48 % (ref 37.5–51.0)
HEMOGLOBIN: 15.6 g/dL (ref 13.0–17.7)
IMMATURE GRANS (ABS): 0 10*3/uL (ref 0.0–0.1)
IMMATURE GRANULOCYTES: 0 %
LYMPHS ABS: 1 10*3/uL (ref 0.7–3.1)
LYMPHS: 21 %
MCH: 31.4 pg (ref 26.6–33.0)
MCHC: 32.5 g/dL (ref 31.5–35.7)
MCV: 97 fL (ref 79–97)
MONOCYTES: 11 %
Monocytes Absolute: 0.5 10*3/uL (ref 0.1–0.9)
NEUTROS PCT: 63 %
Neutrophils Absolute: 3 10*3/uL (ref 1.4–7.0)
Platelets: 188 10*3/uL (ref 150–379)
RBC: 4.97 x10E6/uL (ref 4.14–5.80)
RDW: 12.8 % (ref 12.3–15.4)
WBC: 4.7 10*3/uL (ref 3.4–10.8)

## 2016-12-02 LAB — LIPID PANEL
CHOLESTEROL TOTAL: 206 mg/dL — AB (ref 100–199)
Chol/HDL Ratio: 3.1 ratio units (ref 0.0–5.0)
HDL: 66 mg/dL (ref >39)
LDL Calculated: 126 mg/dL — ABNORMAL HIGH (ref 0–99)
Triglycerides: 72 mg/dL (ref 0–149)
VLDL Cholesterol Cal: 14 mg/dL (ref 5–40)

## 2016-12-02 LAB — HIV ANTIBODY (ROUTINE TESTING W REFLEX): HIV Screen 4th Generation wRfx: NONREACTIVE

## 2016-12-02 LAB — PSA: PROSTATE SPECIFIC AG, SERUM: 2.7 ng/mL (ref 0.0–4.0)

## 2016-12-26 MED ORDER — LISINOPRIL-HYDROCHLOROTHIAZIDE 20-12.5 MG PO TABS: 1.0000 | ORAL_TABLET | Freq: Every day | ORAL | 1 refills | Status: DC

## 2016-12-26 MED ORDER — SIMVASTATIN 80 MG PO TABS: 80.0000 mg | ORAL_TABLET | Freq: Every day | ORAL | 3 refills | Status: DC

## 2017-02-01 ENCOUNTER — Telehealth: Payer: Self-pay

## 2017-02-01 NOTE — Telephone Encounter (Signed)
Pt was seen on [id number] and still has not heard from the lab about his results he has left two messages already with the lab   Best number

## 2017-02-01 NOTE — Telephone Encounter (Signed)
Pt advised of basically nl results, any concerns?

## 2017-02-03 NOTE — Telephone Encounter (Signed)
Call -- labs are all normal except cholesterol is 20 points higher than usual.  Has he been taking Simvastatin 80mg  every day?  Please mail copy of labs to patient.

## 2017-02-03 NOTE — Telephone Encounter (Signed)
lmtcb

## 2017-02-12 NOTE — Telephone Encounter (Signed)
Mailed results and message to call us

## 2017-03-20 ENCOUNTER — Encounter: Payer: Self-pay | Admitting: Family Medicine

## 2017-06-01 ENCOUNTER — Ambulatory Visit (INDEPENDENT_AMBULATORY_CARE_PROVIDER_SITE_OTHER): Payer: Medicare Other | Admitting: Family Medicine

## 2017-06-01 ENCOUNTER — Encounter: Payer: Self-pay | Admitting: Family Medicine

## 2017-06-01 VITALS — BP 135/76 | HR 78 | Temp 97.6°F | Resp 18 | Ht 65.0 in | Wt 162.0 lb

## 2017-06-01 DIAGNOSIS — E663 Overweight: Secondary | ICD-10-CM

## 2017-06-01 DIAGNOSIS — L719 Rosacea, unspecified: Secondary | ICD-10-CM

## 2017-06-01 DIAGNOSIS — E7800 Pure hypercholesterolemia, unspecified: Secondary | ICD-10-CM | POA: Diagnosis not present

## 2017-06-01 DIAGNOSIS — Z23 Encounter for immunization: Secondary | ICD-10-CM

## 2017-06-01 DIAGNOSIS — I10 Essential (primary) hypertension: Secondary | ICD-10-CM

## 2017-06-01 LAB — COMPREHENSIVE METABOLIC PANEL
ALK PHOS: 48 IU/L (ref 39–117)
ALT: 56 IU/L — AB (ref 0–44)
AST: 35 IU/L (ref 0–40)
Albumin/Globulin Ratio: 2 (ref 1.2–2.2)
Albumin: 4.6 g/dL (ref 3.6–4.8)
BUN/Creatinine Ratio: 20 (ref 10–24)
BUN: 18 mg/dL (ref 8–27)
Bilirubin Total: 0.7 mg/dL (ref 0.0–1.2)
CO2: 23 mmol/L (ref 18–29)
CREATININE: 0.91 mg/dL (ref 0.76–1.27)
Calcium: 9.5 mg/dL (ref 8.6–10.2)
Chloride: 99 mmol/L (ref 96–106)
GFR calc Af Amer: 101 mL/min/{1.73_m2} (ref >59)
GFR calc non Af Amer: 88 mL/min/{1.73_m2} (ref >59)
GLUCOSE: 93 mg/dL (ref 65–99)
Globulin, Total: 2.3 g/dL (ref 1.5–4.5)
Potassium: 4.7 mmol/L (ref 3.5–5.2)
Sodium: 137 mmol/L (ref 134–144)
TOTAL PROTEIN: 6.9 g/dL (ref 6.0–8.5)

## 2017-06-01 LAB — LIPID PANEL
CHOLESTEROL TOTAL: 168 mg/dL (ref 100–199)
Chol/HDL Ratio: 3.1 ratio (ref 0.0–5.0)
HDL: 55 mg/dL (ref >39)
LDL Calculated: 100 mg/dL — ABNORMAL HIGH (ref 0–99)
Triglycerides: 67 mg/dL (ref 0–149)
VLDL CHOLESTEROL CAL: 13 mg/dL (ref 5–40)

## 2017-06-01 MED ORDER — LISINOPRIL-HYDROCHLOROTHIAZIDE 20-12.5 MG PO TABS: 1.0000 | ORAL_TABLET | Freq: Every day | ORAL | 1 refills | Status: DC

## 2017-06-01 MED ORDER — ZOSTER VAC RECOMB ADJUVANTED 50 MCG/0.5ML IM SUSR: 0.5000 mL | Freq: Once | INTRAMUSCULAR | 1 refills | Status: AC

## 2017-06-01 NOTE — Patient Instructions (Addendum)
IF you received an x-ray today, you will receive an invoice from [facility]. Please contact [facility] at [phone] with questions or concerns regarding your invoice.   IF you received labwork today, you will receive an invoice from [facility]. Please contact [facility] at [phone] with questions or concerns regarding your invoice.   Our billing staff will not be able to assist you with questions regarding bills from these companies.  You will be contacted with the lab results as soon as they are available. The fastest way to get your results is to activate your My Chart account. Instructions are located on the last page of this paperwork. If you have not heard from [place] regarding the results in 2 weeks, please contact this office.     shinFat and Cholesterol Restricted Diet High levels of fat and cholesterol in your blood may lead to various health problems, such as diseases of the heart, blood vessels, gallbladder, liver, and pancreas. Fats are concentrated sources of energy that come in various forms. Certain types of fat, including saturated fat, may be harmful in excess. Cholesterol is a substance needed by your body in small amounts. Your body makes all the cholesterol it needs. Excess cholesterol comes from the food you eat. When you have high levels of cholesterol and saturated fat in your blood, health problems can develop because the excess fat and cholesterol will gather along the walls of your blood vessels, causing them to narrow. Choosing the right foods will help you control your intake of fat and cholesterol. This will help keep the levels of these substances in your blood within normal limits and reduce your risk of disease. What is my plan? Your health care provider recommends that you:  Limit your fat intake to ______% or less of your total calories per day.  Limit the amount of cholesterol in your diet to less than _________mg per day.  Eat 20-30  grams of fiber each day.  What types of fat should I choose?  Choose healthy fats more often. Choose monounsaturated and polyunsaturated fats, such as olive and canola oil, flaxseeds, walnuts, almonds, and seeds.  Eat more omega-3 fats. Good choices include salmon, mackerel, sardines, tuna, flaxseed oil, and ground flaxseeds. Aim to eat fish at least two times a week.  Limit saturated fats. Saturated fats are primarily found in animal products, such as meats, butter, and cream. Plant sources of saturated fats include palm oil, palm kernel oil, and coconut oil.  Avoid foods with partially hydrogenated oils in them. These contain trans fats. Examples of foods that contain trans fats are stick margarine, some tub margarines, cookies, crackers, and other baked goods. What general guidelines do I need to follow? These guidelines for healthy eating will help you control your intake of fat and cholesterol:  Check food labels carefully to identify foods with trans fats or high amounts of saturated fat.  Fill one half of your plate with vegetables and green salads.  Fill one fourth of your plate with whole grains. Look for the word "whole" as the first word in the ingredient list.  Fill one fourth of your plate with lean protein foods.  Limit fruit to two servings a day. Choose fruit instead of juice.  Eat more foods that contain fiber, such as apples, broccoli, carrots, beans, peas, and barley.  Eat more home-cooked food and less restaurant, buffet, and fast food.  Limit or avoid alcohol.  Limit foods high in starch and sugar.  Limit fried foods.  Cook foods using methods other than frying. Baking, boiling, grilling, and broiling are all great options.  Lose weight if you are overweight. Losing just 5-10% of your initial body weight can help your overall health and prevent diseases such as diabetes and heart disease.  What foods can I eat? Grains  Whole grains, such as whole wheat or  whole grain breads, crackers, cereals, and pasta. Unsweetened oatmeal, bulgur, barley, quinoa, or brown rice. Corn or whole wheat flour tortillas. Vegetables  Fresh or frozen vegetables (raw, steamed, roasted, or grilled). Green salads. Fruits  All fresh, canned (in natural juice), or frozen fruits. Meats and other protein foods  Ground beef (85% or leaner), grass-fed beef, or beef trimmed of fat. Skinless chicken or [place]. Ground chicken or [place]. Pork trimmed of fat. All fish and seafood. Eggs. Dried beans, peas, or lentils. Unsalted nuts or seeds. Unsalted canned or dry beans. Dairy  Low-fat dairy products, such as skim or 1% milk, 2% or reduced-fat cheeses, low-fat ricotta or cottage cheese, or plain low-fat yo Fats and oils  Tub margarines without trans fats. Light or reduced-fat mayonnaise and salad dressings. Avocado. Olive, canola, sesame, or safflower oils. Natural peanut or almond butter (choose ones without added sugar and oil). The items listed above may not be a complete list of recommended foods or beverages. Contact your dietitian for more options. Foods to avoid Grains  White bread. White pasta. White rice. Cornbread. Bagels, pastries, and croissants. Crackers that contain trans fat. Vegetables  White potatoes. Corn. Creamed or fried vegetables. Vegetables in a cheese sauce. Fruits  Dried fruits. Canned fruit in light or heavy syrup. Fruit juice. Meats and other protein foods  Fatty cuts of meat. Ribs, chicken wings, bacon, sausage, bologna, salami, chitterlings, fatback, hot dogs, bratwurst, and packaged luncheon meats. Liver and organ meats. Dairy  Whole or 2% milk, cream, half-and-half, and cream cheese. Whole milk cheeses. Whole-fat or sweetened yogurt. Full-fat cheeses. Nondairy creamers and whipped toppings. Processed cheese, cheese spreads, or cheese curds. Beverages  Alcohol. Sweetened drinks (such as sodas, lemonade, and fruit drinks or punches). Fats  and oils  Butter, stick margarine, lard, shortening, ghee, or bacon fat. Coconut, palm kernel, or palm oils. Sweets and desserts  Corn syrup, sugars, honey, and molasses. Candy. Jam and jelly. Syrup. Sweetened cereals. Cookies, pies, cakes, donuts, muffins, and ice cream. The items listed above may not be a complete list of foods and beverages to avoid. Contact your dietitian for more information. This information is not intended to replace advice given to you by your health care provider. Make sure you discuss any questions you have with your health care provider. Document Released: 12/13/2005 Document Revised: 01/03/2015 Document Reviewed: 03/13/2014 Elsevier Interactive Patient Education  2017 [employer].
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
## 2017-06-20 ENCOUNTER — Encounter: Payer: Self-pay | Admitting: Family Medicine

## 2017-06-20 ENCOUNTER — Other Ambulatory Visit: Payer: Self-pay | Admitting: Family Medicine

## 2017-06-20 DIAGNOSIS — R945 Abnormal results of liver function studies: Principal | ICD-10-CM

## 2017-06-20 DIAGNOSIS — R7989 Other specified abnormal findings of blood chemistry: Secondary | ICD-10-CM

## 2017-06-21 ENCOUNTER — Encounter: Payer: Self-pay | Admitting: Radiology

## 2017-06-27 ENCOUNTER — Telehealth: Payer: Self-pay | Admitting: Family Medicine

## 2017-06-27 NOTE — Telephone Encounter (Signed)
Pt is needing to get a copy of his last lab work sent to his home address he tried to schedule an appt with Dr. Tamala Julian as she requested and was given times on 08/23/17 and 08/24/17 and states that they do not work that he needs to be fasting and stated he would wait for his annual and discuss with her why he could not do the lab follow up .  He also stated that he would email Dr. Tamala Julian of this   Best number [phone]

## 2017-06-28 NOTE — Telephone Encounter (Signed)
Letter sent on 06/21/17

## 2017-08-23 ENCOUNTER — Ambulatory Visit (INDEPENDENT_AMBULATORY_CARE_PROVIDER_SITE_OTHER): Payer: Medicare Other | Admitting: Physician Assistant

## 2017-08-23 DIAGNOSIS — R7989 Other specified abnormal findings of blood chemistry: Secondary | ICD-10-CM

## 2017-08-23 DIAGNOSIS — R945 Abnormal results of liver function studies: Principal | ICD-10-CM

## 2017-08-23 NOTE — Progress Notes (Signed)
Fast track, labs only.

## 2017-08-24 LAB — COMPREHENSIVE METABOLIC PANEL
A/G RATIO: 1.8 (ref 1.2–2.2)
ALT: 37 IU/L (ref 0–44)
AST: 25 IU/L (ref 0–40)
Albumin: 4.4 g/dL (ref 3.6–4.8)
Alkaline Phosphatase: 45 IU/L (ref 39–117)
BUN/Creatinine Ratio: 19 (ref 10–24)
BUN: 17 mg/dL (ref 8–27)
Bilirubin Total: 0.8 mg/dL (ref 0.0–1.2)
CALCIUM: 9.3 mg/dL (ref 8.6–10.2)
CO2: 22 mmol/L (ref 20–29)
Chloride: 101 mmol/L (ref 96–106)
Creatinine, Ser: 0.9 mg/dL (ref 0.76–1.27)
GFR calc Af Amer: 103 mL/min/{1.73_m2} (ref >59)
GFR, EST NON AFRICAN AMERICAN: 89 mL/min/{1.73_m2} (ref >59)
GLUCOSE: 101 mg/dL — AB (ref 65–99)
Globulin, Total: 2.5 g/dL (ref 1.5–4.5)
POTASSIUM: 4.4 mmol/L (ref 3.5–5.2)
Sodium: 138 mmol/L (ref 134–144)
Total Protein: 6.9 g/dL (ref 6.0–8.5)

## 2017-09-02 ENCOUNTER — Encounter: Payer: Self-pay | Admitting: Family Medicine

## 2017-10-10 ENCOUNTER — Encounter: Payer: Self-pay | Admitting: Family Medicine

## 2017-12-07 ENCOUNTER — Encounter: Payer: Medicare Other | Admitting: Family Medicine

## 2017-12-26 ENCOUNTER — Ambulatory Visit (INDEPENDENT_AMBULATORY_CARE_PROVIDER_SITE_OTHER): Payer: Medicare Other

## 2017-12-26 VITALS — BP 122/80 | HR 69 | Ht 65.0 in | Wt 159.0 lb

## 2017-12-26 DIAGNOSIS — I10 Essential (primary) hypertension: Secondary | ICD-10-CM

## 2017-12-26 DIAGNOSIS — Z0000 Encounter for general adult medical examination without abnormal findings: Secondary | ICD-10-CM | POA: Diagnosis not present

## 2017-12-26 DIAGNOSIS — Z125 Encounter for screening for malignant neoplasm of prostate: Secondary | ICD-10-CM
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
## 2017-12-26 NOTE — Patient Instructions (Addendum)
Sean Schwartz , Thank you for taking time to come for your Medicare Wellness Visit. I appreciate your ongoing commitment to your health goals. Please review the following plan we discussed and let me know if I can assist you in the future.   Screening recommendations/referrals: Colonoscopy: up to date, next due 03/11/2026 Recommended yearly ophthalmology/optometry visit for glaucoma screening and checkup Recommended yearly dental visit for hygiene and checkup  Vaccinations: Influenza vaccine: up to date Pneumococcal vaccine: up to date  Tdap vaccine: up to date, next due 11/13/2025 Shingles vaccine: up to date    Advanced directives: Please bring a copy of your POA (Power of [place]) and/or Living Will to your next appointment.   Conditions/risks identified: Try to lose weight and weigh around 155 lbs in the near future.   Next appointment: 01/02/18 @ 8 am with Dr. Tamala Julian   Preventive Care 65 Years and Older, Male Preventive care refers to lifestyle choices and visits with your health care provider that can promote health and wellness. What does preventive care include?  A yearly physical exam. This is also called an annual well check.  Dental exams once or twice a year.  Routine eye exams. Ask your health care provider how often you should have your eyes checked.  Personal lifestyle choices, including:  Daily care of your teeth and gums.  Regular physical activity.  Eating a healthy diet.  Avoiding tobacco and drug use.  Limiting alcohol use.  Practicing safe sex.  Taking low doses of aspirin every day.  Taking vitamin and mineral supplements as recommended by your health care provider. What happens during an annual well check? The services and screenings done by your health care provider during your annual well check will depend on your age, overall health, lifestyle risk factors, and family history of disease. Counseling  Your health care provider may ask you  questions about your:  Alcohol use.  Tobacco use.  Drug use.  Emotional well-being.  Home and relationship well-being.  Sexual activity.  Eating habits.  History of falls.  Memory and ability to understand (cognition).  Work and work Statistician. Screening  You may have the following tests or measurements:  Height, weight, and BMI.  Blood pressure.  Lipid and cholesterol levels. These may be checked every 5 years, or more frequently if you are over 16 years old.  Skin check.  Lung cancer screening. You may have this screening every year starting at age 42 if you have a 30-pack-year history of smoking and currently smoke or have quit within the past 15 years.  Fecal occult blood test (FOBT) of the stool. You may have this test every year starting at age 7.  Flexible sigmoidoscopy or colonoscopy. You may have a sigmoidoscopy every 5 years or a colonoscopy every 10 years starting at age 81.  Prostate cancer screening. Recommendations will vary depending on your family history and other risks.  Hepatitis C blood test.  Hepatitis B blood test.  Sexually transmitted disease (STD) testing.  Diabetes screening. This is done by checking your blood sugar (glucose) after you have not eaten for a while (fasting). You may have this done every 1-3 years.  Abdominal aortic aneurysm (AAA) screening. You may need this if you are a current or former smoker.  Osteoporosis. You may be screened starting at age 50 if you are at high risk. Talk with your health care provider about your test results, treatment options, and if necessary, the need for more tests. Vaccines  Your health care provider may recommend certain vaccines, such as:  Influenza vaccine. This is recommended every year.  Tetanus, diphtheria, and acellular pertussis (Tdap, Td) vaccine. You may need a Td booster every 10 years.  Zoster vaccine. You may need this after age 78.  Pneumococcal 13-valent conjugate  (PCV13) vaccine. One dose is recommended after age 38.  Pneumococcal polysaccharide (PPSV23) vaccine. One dose is recommended after age 75. Talk to your health care provider about which screenings and vaccines you need and how often you need them. This information is not intended to replace advice given to you by your health care provider. Make sure you discuss any questions you have with your health care provider. Document Released: 01/09/2016 Document Revised: 09/01/2016 Document Reviewed: 10/14/2015 Elsevier Interactive Patient Education  2017 [place] Prevention in the Home Falls can cause injuries. They can happen to people of all ages. There are many things you can do to make your home safe and to help prevent falls. What can I do on the outside of my home?  Regularly fix the edges of walkways and driveways and fix any cracks.  Remove anything that might make you trip as you walk through a door, such as a raised step or threshold.  Trim any bushes or trees on the path to your home.  Use bright outdoor lighting.  Clear any walking paths of anything that might make someone trip, such as rocks or tools.  Regularly check to see if handrails are loose or broken. Make sure that both sides of any steps have handrails.  Any raised decks and porches should have guardrails on the edges.  Have any leaves, snow, or ice cleared regularly.  Use sand or salt on walking paths during winter.  Clean up any spills in your garage right away. This includes oil or grease spills. What can I do in the bathroom?  Use night lights.  Install grab bars by the toilet and in the tub and shower. Do not use towel bars as grab bars.  Use non-skid mats or decals in the tub or shower.  If you need to sit down in the shower, use a plastic, non-slip stool.  Keep the floor dry. Clean up any water that spills on the floor as soon as it happens.  Remove soap buildup in the tub or shower  regularly.  Attach bath mats securely with double-sided non-slip rug tape.  Do not have throw rugs and other things on the floor that can make you trip. What can I do in the bedroom?  Use night lights.  Make sure that you have a light by your bed that is easy to reach.  Do not use any sheets or blankets that are too big for your bed. They should not hang down onto the floor.  Have a firm chair that has side arms. You can use this for support while you get dressed.  Do not have throw rugs and other things on the floor that can make you trip. What can I do in the kitchen?  Clean up any spills right away.  Avoid walking on wet floors.  Keep items that you use a lot in easy-to-reach places.  If you need to reach something above you, use a strong step stool that has a grab bar.  Keep electrical cords out of the way.  Do not use floor polish or wax that makes floors slippery. If you must use wax, use non-skid floor wax.  Do  not have throw rugs and other things on the floor that can make you trip. What can I do with my stairs?  Do not leave any items on the stairs.  Make sure that there are handrails on both sides of the stairs and use them. Fix handrails that are broken or loose. Make sure that handrails are as long as the stairways.  Check any carpeting to make sure that it is firmly attached to the stairs. Fix any carpet that is loose or worn.  Avoid having throw rugs at the top or bottom of the stairs. If you do have throw rugs, attach them to the floor with carpet tape.  Make sure that you have a light switch at the top of the stairs and the bottom of the stairs. If you do not have them, ask someone to add them for you. What else can I do to help prevent falls?  Wear shoes that:  Do not have high heels.  Have rubber bottoms.  Are comfortable and fit you well.  Are closed at the toe. Do not wear sandals.  If you use a stepladder:  Make sure that it is fully  opened. Do not climb a closed stepladder.  Make sure that both sides of the stepladder are locked into place.  Ask someone to hold it for you, if possible.  Clearly mark and make sure that you can see:  Any grab bars or handrails.  First and last steps.  Where the edge of each step is.  Use tools that help you move around (mobility aids) if they are needed. These include:  Canes.  Walkers.  Scooters.  Crutches.  Turn on the lights when you go into a dark area. Replace any light bulbs as soon as they burn out.  Set up your furniture so you have a clear path. Avoid moving your furniture around.  If any of your floors are uneven, fix them.  If there are any pets around you, be aware of where they are.  Review your medicines with your doctor. Some medicines can make you feel dizzy. This can increase your chance of falling. Ask your doctor what other things that you can do to help prevent falls. This information is not intended to replace advice given to you by your health care provider. Make sure you discuss any questions you have with your health care provider. Document Released: 10/09/2009 Document Revised: 05/20/2016 Document Reviewed: 01/17/2015 Elsevier Interactive Patient Education  2017 [employer].

## 2017-12-27 LAB — CBC WITH DIFFERENTIAL/PLATELET
BASOS ABS: 0.1 10*3/uL (ref 0.0–0.2)
Basos: 2 %
EOS (ABSOLUTE): 0.2 10*3/uL (ref 0.0–0.4)
EOS: 3 %
HEMATOCRIT: 45.5 % (ref 37.5–51.0)
Hemoglobin: 15.1 g/dL (ref 13.0–17.7)
IMMATURE GRANULOCYTES: 0 %
Immature Grans (Abs): 0 10*3/uL (ref 0.0–0.1)
Lymphocytes Absolute: 1.3 10*3/uL (ref 0.7–3.1)
Lymphs: 28 %
MCH: 31.7 pg (ref 26.6–33.0)
MCHC: 33.2 g/dL (ref 31.5–35.7)
MCV: 95 fL (ref 79–97)
MONOS ABS: 0.6 10*3/uL (ref 0.1–0.9)
Monocytes: 14 %
NEUTROS PCT: 53 %
Neutrophils Absolute: 2.3 10*3/uL (ref 1.4–7.0)
PLATELETS: 201 10*3/uL (ref 150–379)
RBC: 4.77 x10E6/uL (ref 4.14–5.80)
RDW: 12.7 % (ref 12.3–15.4)
WBC: 4.5 10*3/uL (ref 3.4–10.8)

## 2017-12-27 LAB — COMPREHENSIVE METABOLIC PANEL
ALK PHOS: 42 IU/L (ref 39–117)
ALT: 34 IU/L (ref 0–44)
AST: 24 IU/L (ref 0–40)
Albumin/Globulin Ratio: 2.1 (ref 1.2–2.2)
Albumin: 4.6 g/dL (ref 3.6–4.8)
BUN/Creatinine Ratio: 24 (ref 10–24)
BUN: 21 mg/dL (ref 8–27)
Bilirubin Total: 0.7 mg/dL (ref 0.0–1.2)
CHLORIDE: 102 mmol/L (ref 96–106)
CO2: 22 mmol/L (ref 20–29)
CREATININE: 0.88 mg/dL (ref 0.76–1.27)
Calcium: 9.6 mg/dL (ref 8.6–10.2)
GFR calc Af Amer: 103 mL/min/{1.73_m2} (ref >59)
GFR calc non Af Amer: 90 mL/min/{1.73_m2} (ref >59)
GLOBULIN, TOTAL: 2.2 g/dL (ref 1.5–4.5)
GLUCOSE: 95 mg/dL (ref 65–99)
Potassium: 4.8 mmol/L (ref 3.5–5.2)
SODIUM: 139 mmol/L (ref 134–144)
Total Protein: 6.8 g/dL (ref 6.0–8.5)

## 2017-12-27 LAB — LIPID PANEL
CHOL/HDL RATIO: 3.3 ratio (ref 0.0–5.0)
Cholesterol, Total: 162 mg/dL (ref 100–199)
HDL: 49 mg/dL (ref >39)
LDL CALC: 98 mg/dL (ref 0–99)
TRIGLYCERIDES: 74 mg/dL (ref 0–149)
VLDL Cholesterol Cal: 15 mg/dL (ref 5–40)

## 2017-12-27 LAB — PSA: PROSTATE SPECIFIC AG, SERUM: 2.9 ng/mL (ref 0.0–4.0)

## 2018-01-02 ENCOUNTER — Ambulatory Visit: Payer: Medicare Other | Admitting: Family Medicine

## 2018-01-02 ENCOUNTER — Encounter: Payer: Self-pay | Admitting: Family Medicine

## 2018-01-02 ENCOUNTER — Other Ambulatory Visit: Payer: Self-pay

## 2018-01-02 VITALS — BP 122/68 | HR 87 | Temp 97.9°F | Resp 16 | Ht 65.35 in | Wt 161.0 lb

## 2018-01-02 DIAGNOSIS — Z0000 Encounter for general adult medical examination without abnormal findings: Secondary | ICD-10-CM

## 2018-01-02 DIAGNOSIS — I10 Essential (primary) hypertension: Secondary | ICD-10-CM

## 2018-01-02 DIAGNOSIS — E7800 Pure hypercholesterolemia, unspecified: Secondary | ICD-10-CM | POA: Diagnosis not present

## 2018-01-02 DIAGNOSIS — L719 Rosacea, unspecified: Secondary | ICD-10-CM

## 2018-01-02 DIAGNOSIS — L570 Actinic keratosis: Secondary | ICD-10-CM

## 2018-01-02 DIAGNOSIS — K4090 Unilateral inguinal hernia, without obstruction or gangrene, not specified as recurrent: Secondary | ICD-10-CM

## 2018-01-02 DIAGNOSIS — Z6826 Body mass index (BMI) 26.0-26.9, adult: Secondary | ICD-10-CM | POA: Diagnosis not present

## 2018-01-02 DIAGNOSIS — Z85828 Personal history of other malignant neoplasm of skin: Secondary | ICD-10-CM | POA: Diagnosis not present

## 2018-01-02 MED ORDER — SIMVASTATIN 80 MG PO TABS: 80.0000 mg | ORAL_TABLET | Freq: Every day | ORAL | 3 refills | Status: DC

## 2018-01-02 MED ORDER — LISINOPRIL-HYDROCHLOROTHIAZIDE 20-12.5 MG PO TABS: 1.0000 | ORAL_TABLET | Freq: Every day | ORAL | 1 refills | Status: DC

## 2018-01-02 NOTE — Patient Instructions (Addendum)
Preventive Care 67 Years and Older, Male Preventive care refers to lifestyle choices and visits with your health care provider that can promote health and wellness. What does preventive care include?  A yearly physical exam. This is also called an annual well check.  Dental exams once or twice a year.  Routine eye exams. Ask your health care provider how often you should have your eyes checked.  Personal lifestyle choices, including: ? Daily care of your teeth and gums. ? Regular physical activity. ? Eating a healthy diet. ? Avoiding tobacco and drug use. ? Limiting alcohol use. ? Practicing safe sex. ? Taking low doses of aspirin every day. ? Taking vitamin and mineral supplements as recommended by your health care provider. What happens during an annual well check? The services and screenings done by your health care provider during your annual well check will depend on your age, overall health, lifestyle risk factors, and family history of disease. Counseling Your health care provider may ask you questions about your:  Alcohol use.  Tobacco use.  Drug use.  Emotional well-being.  Home and relationship well-being.  Sexual activity.  Eating habits.  History of falls.  Memory and ability to understand (cognition).  Work and work environment.  Screening You may have the following tests or measurements:  Height, weight, and BMI.  Blood pressure.  Lipid and cholesterol levels. These may be checked every 5 years, or more frequently if you are over 50 years old.  Skin check.  Lung cancer screening. You may have this screening every year starting at age 55 if you have a 30-pack-year history of smoking and currently smoke or have quit within the past 15 years.  Fecal occult blood test (FOBT) of the stool. You may have this test every year starting at age 50.  Flexible sigmoidoscopy or colonoscopy. You may have a sigmoidoscopy every 5 years or a colonoscopy every 10  years starting at age 50.  Prostate cancer screening. Recommendations will vary depending on your family history and other risks.  Hepatitis C blood test.  Hepatitis B blood test.  Sexually transmitted disease (STD) testing.  Diabetes screening. This is done by checking your blood sugar (glucose) after you have not eaten for a while (fasting). You may have this done every 1-3 years.  Abdominal aortic aneurysm (AAA) screening. You may need this if you are a current or former smoker.  Osteoporosis. You may be screened starting at age 70 if you are at high risk.  Talk with your health care provider about your test results, treatment options, and if necessary, the need for more tests. Vaccines Your health care provider may recommend certain vaccines, such as:  Influenza vaccine. This is recommended every year.  Tetanus, diphtheria, and acellular pertussis (Tdap, Td) vaccine. You may need a Td booster every 10 years.  Varicella vaccine. You may need this if you have not been vaccinated.  Zoster vaccine. You may need this after age 60.  Measles, mumps, and rubella (MMR) vaccine. You may need at least one dose of MMR if you were born in 1957 or later. You may also need a second dose.  Pneumococcal 13-valent conjugate (PCV13) vaccine. One dose is recommended after age 67.  Pneumococcal polysaccharide (PPSV23) vaccine. One dose is recommended after age 67.  Meningococcal vaccine. You may need this if you have certain conditions.  Hepatitis A vaccine. You may need this if you have certain conditions or if you travel or work in places where you   may be exposed to hepatitis A.  Hepatitis B vaccine. You may need this if you have certain conditions or if you travel or work in places where you may be exposed to hepatitis B.  Haemophilus influenzae type b (Hib) vaccine. You may need this if you have certain risk factors.  Talk to your health care provider about which screenings and vaccines  you need and how often you need them. This information is not intended to replace advice given to you by your health care provider. Make sure you discuss any questions you have with your health care provider. Document Released: 01/09/2016 Document Revised: 09/01/2016 Document Reviewed: 10/14/2015 Elsevier Interactive Patient Education  2018 [employer].     IF you received an x-ray today, you will receive an invoice from [facility]. Please contact [facility] at [phone] with questions or concerns regarding your invoice.   IF you received labwork today, you will receive an invoice from [place]. Please contact [facility] at [phone] with questions or concerns regarding your invoice.   Our billing staff will not be able to assist you with questions regarding bills from these companies.  You will be contacted with the lab results as soon as they are available. The fastest way to get your results is to activate your My Chart account. Instructions are located on the last page of this paperwork. If you have not heard from [place] regarding the results in 2 weeks, please contact this office.
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
## 2018-01-11 ENCOUNTER — Telehealth: Payer: Self-pay

## 2018-01-11 NOTE — Telephone Encounter (Signed)
Copied from [place] [id number]. Topic: General - Other >> Jan 11, 2018  9:33 AM Marin Olp L wrote: Reason for CRM: Patient diagnosed by Zannie Cove with an inguineal hernia and now the bulge he had on right side is gone. He just wanted to make her aware, and he's still going to [facility] where she referred him to have it addressed.

## 2018-01-12 ENCOUNTER — Other Ambulatory Visit: Payer: Self-pay

## 2018-01-12 ENCOUNTER — Encounter: Payer: Self-pay | Admitting: Family Medicine

## 2018-01-12 ENCOUNTER — Ambulatory Visit: Payer: Medicare Other | Admitting: Family Medicine

## 2018-01-12 VITALS — BP 120/68 | HR 104 | Temp 98.2°F | Resp 18 | Ht 65.5 in | Wt 157.8 lb

## 2018-01-12 DIAGNOSIS — R197 Diarrhea, unspecified: Secondary | ICD-10-CM | POA: Diagnosis not present

## 2018-01-12 DIAGNOSIS — K4090 Unilateral inguinal hernia, without obstruction or gangrene, not specified as recurrent: Secondary | ICD-10-CM
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
## 2018-01-12 NOTE — Telephone Encounter (Signed)
Noted. Recommend proceeding with general surgery consultation.

## 2018-01-12 NOTE — Patient Instructions (Addendum)
1. Return to clinic if worsening abdominal pain and diarrhea, fever, chills, unable to maintain hydration, blood in stool, nausea or vomiting 2. Regarding hernia, concerning symptoms would be hernia becomes painful, hard and unable to "push back in".    IF you received an x-ray today, you will receive an invoice from [facility]. Please contact [facility] at [phone] with questions or concerns regarding your invoice.   IF you received labwork today, you will receive an invoice from [facility]. Please contact [facility] at [phone] with questions or concerns regarding your invoice.   Our billing staff will not be able to assist you with questions regarding bills from these companies.  You will be contacted with the lab results as soon as they are available. The fastest way to get your results is to activate your My Chart account. Instructions are located on the last page of this paperwork. If you have not heard from [place] regarding the results in 2 weeks, please contact this office.     Bland Diet A bland diet consists of foods that do not have a lot of fat or fiber. Foods without fat or fiber are easier for the body to digest. They are also less likely to irritate your mouth, throat, stomach, and other parts of your gastrointestinal tract. A bland diet is sometimes called a BRAT diet. What is my plan? Your health care provider or dietitian may recommend specific changes to your diet to prevent and treat your symptoms, such as:  Eating small meals often.  Cooking food until it is soft enough to chew easily.  Chewing your food well.  Drinking fluids slowly.  Not eating foods that are very spicy, sour, or fatty.  Not eating citrus fruits, such as oranges and grapefruit.  What do I need to know about this diet?  Eat a variety of foods from the bland diet food list.  Do not follow a bland diet longer than you have to.  Ask your health care provider whether you  should take vitamins. What foods can I eat? Grains  Hot cereals, such as cream of wheat. Bread, crackers, or tortillas made from refined white flour. Rice. Vegetables Canned or cooked vegetables. Mashed or boiled potatoes. Fruits Bananas. Applesauce. Other types of cooked or canned fruit with the skin and seeds removed, such as canned peaches or pears. Meats and Other Protein Sources Scrambled eggs. Creamy peanut butter or other nut butters. Lean, well-cooked meats, such as chicken or fish. Tofu. Soups or broths. Dairy Low-fat dairy products, such as milk, cottage cheese, or yogurt. Beverages Water. Herbal tea. Apple juice. Sweets and Desserts Pudding. Custard. Fruit gelatin. Ice cream. Fats and Oils Mild salad dressings. Canola or olive oil. The items listed above may not be a complete list of allowed foods or beverages. Contact your dietitian for more options. What foods are not recommended? Foods and ingredients that are often not recommended include:  Spicy foods, such as hot sauce or salsa.  Fried foods.  Sour foods, such as pickled or fermented foods.  Raw vegetables or fruits, especially citrus or berries.  Caffeinated drinks.  Alcohol.  Strongly flavored seasonings or condiments.  The items listed above may not be a complete list of foods and beverages that are not allowed. Contact your dietitian for more information. This information is not intended to replace advice given to you by your health care provider. Make sure you discuss any questions you have with your health care provider. Document Released: 04/05/2016 Document Revised:  05/20/2016 Document Reviewed: 12/25/2014 Elsevier Interactive Patient Education  [employer].

## 2018-01-24 ENCOUNTER — Encounter: Payer: Medicare Other | Admitting: Family Medicine

## 2018-03-03 ENCOUNTER — Encounter: Payer: Self-pay | Admitting: Family Medicine

## 2018-05-23 ENCOUNTER — Encounter: Payer: Self-pay | Admitting: Family Medicine

## 2018-07-03 ENCOUNTER — Ambulatory Visit: Payer: Medicare Other | Admitting: Family Medicine

## 2018-08-18 ENCOUNTER — Other Ambulatory Visit: Payer: Self-pay | Admitting: Family Medicine

## 2018-08-18 DIAGNOSIS — I10 Essential (primary) hypertension: Secondary | ICD-10-CM

## 2018-08-18 DIAGNOSIS — E7800 Pure hypercholesterolemia, unspecified: Secondary | ICD-10-CM

## 2018-08-18 NOTE — Telephone Encounter (Signed)
This medication needs a refill for former [facility] patient. Patient was supposed to follow up in 6 months around 07/02/18 and appointment was canceled via [place]. Establish care visit scheduled with Dr. Pamella Pert on 01/04/2019.   Lisinopril-HCTZ refill Last Refill:01/02/18 # 90/1 refill Last OV: 01/02/18; Upcoming 01/04/2019 PCP: Former Tamala Julian; [place]: [place], [place] - [address] [phone] (Phone) [phone] (Fax)

## 2018-08-19 ENCOUNTER — Encounter: Payer: Self-pay | Admitting: Family Medicine

## 2018-08-21 ENCOUNTER — Other Ambulatory Visit: Payer: Self-pay | Admitting: *Deleted

## 2018-08-21 DIAGNOSIS — I10 Essential (primary) hypertension: Secondary | ICD-10-CM

## 2018-08-21 MED ORDER — LISINOPRIL-HYDROCHLOROTHIAZIDE 20-12.5 MG PO TABS: 1.0000 | ORAL_TABLET | Freq: Every day | ORAL | 1 refills | Status: DC

## 2018-10-10 ENCOUNTER — Telehealth: Payer: Self-pay | Admitting: Family Medicine

## 2018-10-10 NOTE — Telephone Encounter (Signed)
LVM to advise pt of the cancellation of his AWV with [place] on 12/26/18. He will have the AWV along with his CPE on 01/04/19 at 8 AM.  Thank you!

## 2018-12-26 ENCOUNTER — Ambulatory Visit: Payer: Self-pay

## 2019-01-04 ENCOUNTER — Encounter: Payer: Self-pay | Admitting: Family Medicine

## 2019-01-04 ENCOUNTER — Ambulatory Visit (INDEPENDENT_AMBULATORY_CARE_PROVIDER_SITE_OTHER): Payer: Medicare Other | Admitting: Family Medicine

## 2019-01-04 ENCOUNTER — Other Ambulatory Visit: Payer: Self-pay

## 2019-01-04 VITALS — BP 140/72 | HR 68 | Temp 97.8°F | Resp 18 | Ht 65.0 in | Wt 159.0 lb

## 2019-01-04 DIAGNOSIS — Z125 Encounter for screening for malignant neoplasm of prostate: Secondary | ICD-10-CM | POA: Diagnosis not present

## 2019-01-04 DIAGNOSIS — I10 Essential (primary) hypertension: Secondary | ICD-10-CM | POA: Diagnosis not present

## 2019-01-04 DIAGNOSIS — Z0001 Encounter for general adult medical examination with abnormal findings: Secondary | ICD-10-CM

## 2019-01-04 DIAGNOSIS — E7800 Pure hypercholesterolemia, unspecified: Secondary | ICD-10-CM

## 2019-01-04 DIAGNOSIS — Z0000 Encounter for general adult medical examination without abnormal findings: Secondary | ICD-10-CM

## 2019-01-04 MED ORDER — SIMVASTATIN 80 MG PO TABS: 80.0000 mg | ORAL_TABLET | Freq: Every day | ORAL | 3 refills | Status: DC

## 2019-01-04 MED ORDER — LISINOPRIL-HYDROCHLOROTHIAZIDE 20-12.5 MG PO TABS: 1.0000 | ORAL_TABLET | Freq: Every day | ORAL | 3 refills | Status: DC

## 2019-01-04 MED ORDER — DOXYCYCLINE HYCLATE 50 MG PO CAPS: 50.0000 mg | ORAL_CAPSULE | Freq: Every day | ORAL | 2 refills | Status: DC

## 2019-01-04 NOTE — Patient Instructions (Addendum)
   If you have lab work done today you will be contacted with your lab results within the next 2 weeks.  If you have not heard from us then please contact us. The fastest way to get your results is to register for My Chart.   IF you received an x-ray today, you will receive an invoice from [facility]. Please contact [facility] at [phone] with questions or concerns regarding your invoice.   IF you received labwork today, you will receive an invoice from [facility]. Please contact [facility] at [phone] with questions or concerns regarding your invoice.   Our billing staff will not be able to assist you with questions regarding bills from these companies.  You will be contacted with the lab results as soon as they are available. The fastest way to get your results is to activate your My Chart account. Instructions are located on the last page of this paperwork. If you have not heard from us regarding the results in 2 weeks, please contact this office.     Preventive Care 65 Years and Older, Male Preventive care refers to lifestyle choices and visits with your health care provider that can promote health and wellness. What does preventive care include?   A yearly physical exam. This is also called an annual well check.  Dental exams once or twice a year.  Routine eye exams. Ask your health care provider how often you should have your eyes checked.  Personal lifestyle choices, including: ? Daily care of your teeth and gums. ? Regular physical activity. ? Eating a healthy diet. ? Avoiding tobacco and drug use. ? Limiting alcohol use. ? Practicing safe sex. ? Taking low doses of aspirin every day. ? Taking vitamin and mineral supplements as recommended by your health care provider. What happens during an annual well check? The services and screenings done by your health care provider during your annual well check will depend on your age, overall health,  lifestyle risk factors, and family history of disease. Counseling Your health care provider may ask you questions about your:  Alcohol use.  Tobacco use.  Drug use.  Emotional well-being.  Home and relationship well-being.  Sexual activity.  Eating habits.  History of falls.  Memory and ability to understand (cognition).  Work and work environment. Screening You may have the following tests or measurements:  Height, weight, and BMI.  Blood pressure.  Lipid and cholesterol levels. These may be checked every 5 years, or more frequently if you are over 50 years old.  Skin check.  Lung cancer screening. You may have this screening every year starting at age 55 if you have a 30-pack-year history of smoking and currently smoke or have quit within the past 15 years.  Colorectal cancer screening. All adults should have this screening starting at age 50 and continuing until age 75. You will have tests every 1-10 years, depending on your results and the type of screening test. People at increased risk should start screening at an earlier age. Screening tests may include: ? Guaiac-based fecal occult blood testing. ? Fecal immunochemical test (FIT). ? Stool DNA test. ? Virtual colonoscopy. ? Sigmoidoscopy. During this test, a flexible tube with a tiny camera (sigmoidoscope) is used to examine your rectum and lower colon. The sigmoidoscope is inserted through your anus into your rectum and lower colon. ? Colonoscopy. During this test, a long, thin, flexible tube with a tiny camera (colonoscope) is used to examine your entire colon and   rectum.  Prostate cancer screening. Recommendations will vary depending on your family history and other risks.  Hepatitis C blood test.  Hepatitis B blood test.  Sexually transmitted disease (STD) testing.  Diabetes screening. This is done by checking your blood sugar (glucose) after you have not eaten for a while (fasting). You may have this  done every 1-3 years.  Abdominal aortic aneurysm (AAA) screening. You may need this if you are a current or former smoker.  Osteoporosis. You may be screened starting at age 53 if you are at high risk. Talk with your health care provider about your test results, treatment options, and if necessary, the need for more tests. Vaccines Your health care provider may recommend certain vaccines, such as:  Influenza vaccine. This is recommended every year.  Tetanus, diphtheria, and acellular pertussis (Tdap, Td) vaccine. You may need a Td booster every 10 years.  Varicella vaccine. You may need this if you have not been vaccinated.  Zoster vaccine. You may need this after age 49.  Measles, mumps, and rubella (MMR) vaccine. You may need at least one dose of MMR if you were born in 1957 or later. You may also need a second dose.  Pneumococcal 13-valent conjugate (PCV13) vaccine. One dose is recommended after age 29.  Pneumococcal polysaccharide (PPSV23) vaccine. One dose is recommended after age 73.  Meningococcal vaccine. You may need this if you have certain conditions.  Hepatitis A vaccine. You may need this if you have certain conditions or if you travel or work in places where you may be exposed to hepatitis A.  Hepatitis B vaccine. You may need this if you have certain conditions or if you travel or work in places where you may be exposed to hepatitis B.  Haemophilus influenzae type b (Hib) vaccine. You may need this if you have certain risk factors. Talk to your health care provider about which screenings and vaccines you need and how often you need them. This information is not intended to replace advice given to you by your health care provider. Make sure you discuss any questions you have with your health care provider. Document Released: 01/09/2016 Document Revised: 02/02/2018 Document Reviewed: 10/14/2015 Elsevier Interactive Patient Education  2019 [employer].
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
## 2019-01-05 LAB — CMP14+EGFR
ALT: 38 IU/L (ref 0–44)
AST: 28 IU/L (ref 0–40)
Albumin/Globulin Ratio: 2.1 (ref 1.2–2.2)
Albumin: 4.9 g/dL — ABNORMAL HIGH (ref 3.6–4.8)
Alkaline Phosphatase: 53 IU/L (ref 39–117)
BUN/Creatinine Ratio: 20 (ref 10–24)
BUN: 20 mg/dL (ref 8–27)
Bilirubin Total: 1 mg/dL (ref 0.0–1.2)
CO2: 21 mmol/L (ref 20–29)
Calcium: 10.1 mg/dL (ref 8.6–10.2)
Chloride: 98 mmol/L (ref 96–106)
Creatinine, Ser: 0.98 mg/dL (ref 0.76–1.27)
GFR calc Af Amer: 92 mL/min/{1.73_m2} (ref >59)
GFR calc non Af Amer: 79 mL/min/{1.73_m2} (ref >59)
Globulin, Total: 2.3 g/dL (ref 1.5–4.5)
Glucose: 91 mg/dL (ref 65–99)
Potassium: 4.5 mmol/L (ref 3.5–5.2)
Sodium: 138 mmol/L (ref 134–144)
Total Protein: 7.2 g/dL (ref 6.0–8.5)

## 2019-01-05 LAB — LIPID PANEL
Chol/HDL Ratio: 3.4 ratio (ref 0.0–5.0)
Cholesterol, Total: 198 mg/dL (ref 100–199)
HDL: 59 mg/dL (ref >39)
LDL Calculated: 121 mg/dL — ABNORMAL HIGH (ref 0–99)
Triglycerides: 90 mg/dL (ref 0–149)
VLDL Cholesterol Cal: 18 mg/dL (ref 5–40)

## 2019-01-05 LAB — PSA: Prostate Specific Ag, Serum: 2.9 ng/mL (ref 0.0–4.0)

## 2019-01-11 ENCOUNTER — Encounter: Payer: Self-pay | Admitting: Family Medicine

## 2019-06-11 ENCOUNTER — Encounter: Payer: Medicare Other | Admitting: Family Medicine

## 2019-12-31 ENCOUNTER — Encounter: Payer: Self-pay | Admitting: Family Medicine

## 2019-12-31 ENCOUNTER — Ambulatory Visit (INDEPENDENT_AMBULATORY_CARE_PROVIDER_SITE_OTHER): Payer: Medicare PPO | Admitting: Family Medicine

## 2019-12-31 ENCOUNTER — Other Ambulatory Visit: Payer: Self-pay

## 2019-12-31 VITALS — BP 145/76 | HR 73 | Temp 98.1°F | Wt 158.4 lb

## 2019-12-31 DIAGNOSIS — Z125 Encounter for screening for malignant neoplasm of prostate: Secondary | ICD-10-CM | POA: Diagnosis not present

## 2019-12-31 DIAGNOSIS — I10 Essential (primary) hypertension: Secondary | ICD-10-CM | POA: Diagnosis not present

## 2019-12-31 DIAGNOSIS — E7800 Pure hypercholesterolemia, unspecified: Secondary | ICD-10-CM

## 2019-12-31 MED ORDER — DOXYCYCLINE HYCLATE 50 MG PO CAPS: 50.0000 mg | ORAL_CAPSULE | Freq: Every day | ORAL | 2 refills | Status: DC

## 2019-12-31 MED ORDER — LISINOPRIL-HYDROCHLOROTHIAZIDE 20-12.5 MG PO TABS: 1.0000 | ORAL_TABLET | Freq: Every day | ORAL | 3 refills | Status: DC

## 2019-12-31 MED ORDER — LISINOPRIL-HYDROCHLOROTHIAZIDE 20-25 MG PO TABS: 1.0000 | ORAL_TABLET | Freq: Every day | ORAL | 3 refills | Status: DC

## 2019-12-31 MED ORDER — SIMVASTATIN 80 MG PO TABS: 80.0000 mg | ORAL_TABLET | Freq: Every day | ORAL | 3 refills | Status: DC

## 2019-12-31 NOTE — Patient Instructions (Addendum)
Increasing bp medication as discussed.  Please return in 2 weeks for lab/nurse visit to recheck potassium with increase in diuretic and to have BP checked.     If you have lab work done today you will be contacted with your lab results within the next 2 weeks.  If you have not heard from [place] then please contact us. The fastest way to get your results is to register for My Chart.   IF you received an x-ray today, you will receive an invoice from [facility]. Please contact [facility] at [phone] with questions or concerns regarding your invoice.   IF you received labwork today, you will receive an invoice from [facility]. Please contact [facility] at [phone] with questions or concerns regarding your invoice.   Our billing staff will not be able to assist you with questions regarding bills from these companies.  You will be contacted with the lab results as soon as they are available. The fastest way to get your results is to activate your My Chart account. Instructions are located on the last page of this paperwork. If you have not heard from [place] regarding the results in 2 weeks, please contact this office.      DASH Eating Plan DASH stands for "Dietary Approaches to Stop Hypertension." The DASH eating plan is a healthy eating plan that has been shown to reduce high blood pressure (hypertension). It may also reduce your risk for type 2 diabetes, heart disease, and stroke. The DASH eating plan may also help with weight loss. What are tips for following this plan?  General guidelines  Avoid eating more than 2,300 mg (milligrams) of salt (sodium) a day. If you have hypertension, you may need to reduce your sodium intake to 1,500 mg a day.  Limit alcohol intake to no more than 1 drink a day for nonpregnant women and 2 drinks a day for men. One drink equals 12 oz of beer, 5 oz of wine, or 1 oz of hard liquor.  Work with your health care provider to maintain a healthy  body weight or to lose weight. Ask what an ideal weight is for you.  Get at least 30 minutes of exercise that causes your heart to beat faster (aerobic exercise) most days of the week. Activities may include walking, swimming, or biking.  Work with your health care provider or diet and nutrition specialist (dietitian) to adjust your eating plan to your individual calorie needs. Reading food labels   Check food labels for the amount of sodium per serving. Choose foods with less than 5 percent of the Daily Value of sodium. Generally, foods with less than 300 mg of sodium per serving fit into this eating plan.  To find whole grains, look for the word "whole" as the first word in the ingredient list. Shopping  Buy products labeled as "low-sodium" or "no salt added."  Buy fresh foods. Avoid canned foods and premade or frozen meals. Cooking  Avoid adding salt when cooking. Use salt-free seasonings or herbs instead of table salt or sea salt. Check with your health care provider or pharmacist before using salt substitutes.  Do not fry foods. Cook foods using healthy methods such as baking, boiling, grilling, and broiling instead.  Cook with heart-healthy oils, such as olive, canola, soybean, or sunflower oil. Meal planning  Eat a balanced diet that includes: ? 5 or more servings of fruits and vegetables each day. At each meal, try to fill half of your plate with fruits  and vegetables. ? Up to 6-8 servings of whole grains each day. ? Less than 6 oz of lean meat, poultry, or fish each day. A 3-oz serving of meat is about the same size as a deck of cards. One egg equals 1 oz. ? 2 servings of low-fat dairy each day. ? A serving of nuts, seeds, or beans 5 times each week. ? Heart-healthy fats. Healthy fats called Omega-3 fatty acids are found in foods such as flaxseeds and coldwater fish, like sardines, salmon, and mackerel.  Limit how much you eat of the following: ? Canned or prepackaged  foods. ? Food that is high in trans fat, such as fried foods. ? Food that is high in saturated fat, such as fatty meat. ? Sweets, desserts, sugary drinks, and other foods with added sugar. ? Full-fat dairy products.  Do not salt foods before eating.  Try to eat at least 2 vegetarian meals each week.  Eat more home-cooked food and less restaurant, buffet, and fast food.  When eating at a restaurant, ask that your food be prepared with less salt or no salt, if possible. What foods are recommended? The items listed may not be a complete list. Talk with your dietitian about what dietary choices are best for you. Grains Whole-grain or whole-wheat bread. Whole-grain or whole-wheat pasta. Brown rice. [place] Morrow. Bulgur. Whole-grain and low-sodium cereals. Pita bread. Low-fat, low-sodium crackers. Whole-wheat flour tortillas. Vegetables Fresh or frozen vegetables (raw, steamed, roasted, or grilled). Low-sodium or reduced-sodium tomato and vegetable juice. Low-sodium or reduced-sodium tomato sauce and tomato paste. Low-sodium or reduced-sodium canned vegetables. Fruits All fresh, dried, or frozen fruit. Canned fruit in natural juice (without added sugar). Meat and other protein foods Skinless chicken or [place]. Ground chicken or [place]. Pork with fat trimmed off. Fish and seafood. Egg whites. Dried beans, peas, or lentils. Unsalted nuts, nut butters, and seeds. Unsalted canned beans. Lean cuts of beef with fat trimmed off. Low-sodium, lean deli meat. Dairy Low-fat (1%) or fat-free (skim) milk. Fat-free, low-fat, or reduced-fat cheeses. Nonfat, low-sodium ricotta or cottage cheese. Low-fat or nonfat yogurt. Low-fat, low-sodium cheese. Fats and oils Soft margarine without trans fats. Vegetable oil. Low-fat, reduced-fat, or light mayonnaise and salad dressings (reduced-sodium). Canola, safflower, olive, soybean, and sunflower oils. Avocado. Seasoning and other foods Herbs. Spices. Seasoning  mixes without salt. Unsalted popcorn and pretzels. Fat-free sweets. What foods are not recommended? The items listed may not be a complete list. Talk with your dietitian about what dietary choices are best for you. Grains Baked goods made with fat, such as croissants, muffins, or some breads. Dry pasta or rice meal packs. Vegetables Creamed or fried vegetables. Vegetables in a cheese sauce. Regular canned vegetables (not low-sodium or reduced-sodium). Regular canned tomato sauce and paste (not low-sodium or reduced-sodium). Regular tomato and vegetable juice (not low-sodium or reduced-sodium). Angie Fava. Olives. Fruits Canned fruit in a light or heavy syrup. Fried fruit. Fruit in cream or butter sauce. Meat and other protein foods Fatty cuts of meat. Ribs. Fried meat. Berniece Salines. Sausage. Bologna and other processed lunch meats. Salami. Fatback. Hotdogs. Bratwurst. Salted nuts and seeds. Canned beans with added salt. Canned or smoked fish. Whole eggs or egg yolks. Chicken or [place] with skin. Dairy Whole or 2% milk, cream, and half-and-half. Whole or full-fat cream cheese. Whole-fat or sweetened yogurt. Full-fat cheese. Nondairy creamers. Whipped toppings. Processed cheese and cheese spreads. Fats and oils Butter. Stick margarine. Lard. Shortening. Ghee. Bacon fat. Tropical oils, such as coconut, palm kernel,  or palm oil. Seasoning and other foods Salted popcorn and pretzels. Onion salt, garlic salt, seasoned salt, table salt, and sea salt. Worcestershire sauce. Tartar sauce. Barbecue sauce. Teriyaki sauce. Soy sauce, including reduced-sodium. Steak sauce. Canned and packaged gravies. Fish sauce. Oyster sauce. Cocktail sauce. Horseradish that you find on the shelf. Ketchup. Mustard. Meat flavorings and tenderizers. Bouillon cubes. Hot sauce and Tabasco sauce. Premade or packaged marinades. Premade or packaged taco seasonings. Relishes. Regular salad dressings. Where to find more information:  National  Heart, Lung, and [place]: https://wilson-eaton.com/  American Heart Association: www.heart.org Summary  The DASH eating plan is a healthy eating plan that has been shown to reduce high blood pressure (hypertension). It may also reduce your risk for type 2 diabetes, heart disease, and stroke.  With the DASH eating plan, you should limit salt (sodium) intake to 2,300 mg a day. If you have hypertension, you may need to reduce your sodium intake to 1,500 mg a day.  When on the DASH eating plan, aim to eat more fresh fruits and vegetables, whole grains, lean proteins, low-fat dairy, and heart-healthy fats.  Work with your health care provider or diet and nutrition specialist (dietitian) to adjust your eating plan to your individual calorie needs. This information is not intended to replace advice given to you by your health care provider. Make sure you discuss any questions you have with your health care provider. Document Revised: 11/25/2017 Document Reviewed: 12/06/2016 Elsevier Patient Education  2020 [employer].
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
## 2020-01-01 ENCOUNTER — Telehealth: Payer: Self-pay | Admitting: *Deleted

## 2020-01-01 ENCOUNTER — Other Ambulatory Visit: Payer: Self-pay | Admitting: Family Medicine

## 2020-01-01 LAB — COMPREHENSIVE METABOLIC PANEL
ALT: 44 IU/L (ref 0–44)
AST: 34 IU/L (ref 0–40)
Albumin/Globulin Ratio: 1.9 (ref 1.2–2.2)
Albumin: 4.8 g/dL (ref 3.8–4.8)
Alkaline Phosphatase: 55 IU/L (ref 39–117)
BUN/Creatinine Ratio: 14 (ref 10–24)
BUN: 13 mg/dL (ref 8–27)
Bilirubin Total: 1 mg/dL (ref 0.0–1.2)
CO2: 22 mmol/L (ref 20–29)
Calcium: 10.1 mg/dL (ref 8.6–10.2)
Chloride: 98 mmol/L (ref 96–106)
Creatinine, Ser: 0.91 mg/dL (ref 0.76–1.27)
GFR calc Af Amer: 100 mL/min/{1.73_m2} (ref >59)
GFR calc non Af Amer: 86 mL/min/{1.73_m2} (ref >59)
Globulin, Total: 2.5 g/dL (ref 1.5–4.5)
Glucose: 91 mg/dL (ref 65–99)
Potassium: 4.2 mmol/L (ref 3.5–5.2)
Sodium: 136 mmol/L (ref 134–144)
Total Protein: 7.3 g/dL (ref 6.0–8.5)

## 2020-01-01 LAB — LIPID PANEL
Chol/HDL Ratio: 3.5 ratio (ref 0.0–5.0)
Cholesterol, Total: 201 mg/dL — ABNORMAL HIGH (ref 100–199)
HDL: 58 mg/dL (ref >39)
LDL Chol Calc (NIH): 128 mg/dL — ABNORMAL HIGH (ref 0–99)
Triglycerides: 82 mg/dL (ref 0–149)
VLDL Cholesterol Cal: 15 mg/dL (ref 5–40)

## 2020-01-01 LAB — PSA: Prostate Specific Ag, Serum: 2.9 ng/mL (ref 0.0–4.0)

## 2020-01-01 MED ORDER — ROSUVASTATIN CALCIUM 20 MG PO TABS: 20.0000 mg | ORAL_TABLET | Freq: Every day | ORAL | 3 refills | Status: DC

## 2020-01-01 NOTE — Telephone Encounter (Signed)
Schedule AWV.  

## 2020-01-03 ENCOUNTER — Ambulatory Visit (INDEPENDENT_AMBULATORY_CARE_PROVIDER_SITE_OTHER): Payer: Medicare PPO | Admitting: Family Medicine

## 2020-01-03 VITALS — BP 145/76 | Ht 66.0 in | Wt 158.0 lb

## 2020-01-03 DIAGNOSIS — Z0000 Encounter for general adult medical examination without abnormal findings: Secondary | ICD-10-CM | POA: Diagnosis not present
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
## 2020-01-03 NOTE — Patient Instructions (Addendum)
Thank you for taking time to come for your Medicare Wellness Visit. I appreciate your ongoing commitment to your health goals. Please review the following plan we discussed and let me know if I can assist you in the future.  Sean Kennedy LPN  Preventive Care 69 Years and Older, Male Preventive care refers to lifestyle choices and visits with your health care provider that can promote health and wellness. This includes:  A yearly physical exam. This is also called an annual well check.  Regular dental and eye exams.  Immunizations.  Screening for certain conditions.  Healthy lifestyle choices, such as diet and exercise. What can I expect for my preventive care visit? Physical exam Your health care provider will check:  Height and weight. These may be used to calculate body mass index (BMI), which is a measurement that tells if you are at a healthy weight.  Heart rate and blood pressure.  Your skin for abnormal spots. Counseling Your health care provider may ask you questions about:  Alcohol, tobacco, and drug use.  Emotional well-being.  Home and relationship well-being.  Sexual activity.  Eating habits.  History of falls.  Memory and ability to understand (cognition).  Work and work Statistician. What immunizations do I need?  Influenza (flu) vaccine  This is recommended every year. Tetanus, diphtheria, and pertussis (Tdap) vaccine  You may need a Td booster every 10 years. Varicella (chickenpox) vaccine  You may need this vaccine if you have not already been vaccinated. Zoster (shingles) vaccine  You may need this after age 66. Pneumococcal conjugate (PCV13) vaccine  One dose is recommended after age 84. Pneumococcal polysaccharide (PPSV23) vaccine  One dose is recommended after age 88. Measles, mumps, and rubella (MMR) vaccine  You may need at least one dose of MMR if you were born in 1957 or later. You may also need a second dose. Meningococcal  conjugate (MenACWY) vaccine  You may need this if you have certain conditions. Hepatitis A vaccine  You may need this if you have certain conditions or if you travel or work in places where you may be exposed to hepatitis A. Hepatitis B vaccine  You may need this if you have certain conditions or if you travel or work in places where you may be exposed to hepatitis B. Haemophilus influenzae type b (Hib) vaccine  You may need this if you have certain conditions. You may receive vaccines as individual doses or as more than one vaccine together in one shot (combination vaccines). Talk with your health care provider about the risks and benefits of combination vaccines. What tests do I need? Blood tests  Lipid and cholesterol levels. These may be checked every 5 years, or more frequently depending on your overall health.  Hepatitis C test.  Hepatitis B test. Screening  Lung cancer screening. You may have this screening every year starting at age 24 if you have a 30-pack-year history of smoking and currently smoke or have quit within the past 15 years.  Colorectal cancer screening. All adults should have this screening starting at age 52 and continuing until age 61. Your health care provider may recommend screening at age 57 if you are at increased risk. You will have tests every 1-10 years, depending on your results and the type of screening test.  Prostate cancer screening. Recommendations will vary depending on your family history and other risks.  Diabetes screening. This is done by checking your blood sugar (glucose) after you have not eaten for  a while (fasting). You may have this done every 1-3 years.  Abdominal aortic aneurysm (AAA) screening. You may need this if you are a current or former smoker.  Sexually transmitted disease (STD) testing. Follow these instructions at home: Eating and drinking  Eat a diet that includes fresh fruits and vegetables, whole grains, lean  protein, and low-fat dairy products. Limit your intake of foods with high amounts of sugar, saturated fats, and salt.  Take vitamin and mineral supplements as recommended by your health care provider.  Do not drink alcohol if your health care provider tells you not to drink.  If you drink alcohol: ? Limit how much you have to 0-2 drinks a day. ? Be aware of how much alcohol is in your drink. In the [place] one drink equals one 12 oz bottle of beer (355 mL), one 5 oz glass of wine (148 mL), or one 1 oz glass of hard liquor (44 mL). Lifestyle  Take daily care of your teeth and gums.  Stay active. Exercise for at least 30 minutes on 5 or more days each week.  Do not use any products that contain nicotine or tobacco, such as cigarettes, e-cigarettes, and chewing tobacco. If you need help quitting, ask your health care provider.  If you are sexually active, practice safe sex. Use a condom or other form of protection to prevent STIs (sexually transmitted infections).  Talk with your health care provider about taking a low-dose aspirin or statin. What's next?  Visit your health care provider once a year for a well check visit.  Ask your health care provider how often you should have your eyes and teeth checked.  Stay up to date on all vaccines. This information is not intended to replace advice given to you by your health care provider. Make sure you discuss any questions you have with your health care provider. Document Revised: 12/07/2018 Document Reviewed: 12/07/2018 Elsevier Patient Education  2020 Elsevier Inc.  

## 2020-01-14 ENCOUNTER — Other Ambulatory Visit: Payer: Self-pay

## 2020-01-14 ENCOUNTER — Encounter: Payer: Self-pay | Admitting: Family Medicine

## 2020-01-14 ENCOUNTER — Ambulatory Visit (INDEPENDENT_AMBULATORY_CARE_PROVIDER_SITE_OTHER): Payer: Medicare PPO | Admitting: Family Medicine

## 2020-01-14 DIAGNOSIS — I10 Essential (primary) hypertension: Secondary | ICD-10-CM

## 2020-01-15 LAB — BASIC METABOLIC PANEL
BUN/Creatinine Ratio: 20 (ref 10–24)
BUN: 19 mg/dL (ref 8–27)
CO2: 23 mmol/L (ref 20–29)
Calcium: 9.7 mg/dL (ref 8.6–10.2)
Chloride: 96 mmol/L (ref 96–106)
Creatinine, Ser: 0.97 mg/dL (ref 0.76–1.27)
GFR calc Af Amer: 92 mL/min/{1.73_m2} (ref >59)
GFR calc non Af Amer: 80 mL/min/{1.73_m2} (ref >59)
Glucose: 100 mg/dL — ABNORMAL HIGH (ref 65–99)
Potassium: 4.1 mmol/L (ref 3.5–5.2)
Sodium: 135 mmol/L (ref 134–144)

## 2020-02-02 ENCOUNTER — Encounter: Payer: Self-pay | Admitting: Family Medicine

## 2020-02-10 ENCOUNTER — Ambulatory Visit: Payer: Medicare Other | Attending: Internal Medicine

## 2020-02-10 DIAGNOSIS — Z23 Encounter for immunization: Secondary | ICD-10-CM | POA: Insufficient documentation

## 2020-02-10 NOTE — Progress Notes (Signed)
   Covid-19 Vaccination Clinic  Name:  Sean Schwartz    MRN: [id number] DOB: 20-Oct-1951  02/10/2020  [name] was observed post Covid-19 immunization for 15 minutes without incidence. He was provided with Vaccine Information Sheet and instruction to access the V-Safe system.   [name] was instructed to call 911 with any severe reactions post vaccine: Marland Kitchen Difficulty breathing  . Swelling of your face and throat  . A fast heartbeat  . A bad rash all over your body  . Dizziness and weakness    Immunizations Administered    Name Date Dose VIS Date Route   Pfizer COVID-19 Vaccine 02/10/2020  8:47 AM 0.3 mL 12/07/2019 Intramuscular   Manufacturer: [place]   Lot: [id number]   [place]: [id number]

## 2020-03-04 ENCOUNTER — Ambulatory Visit: Payer: Medicare Other | Attending: Internal Medicine

## 2020-03-04 DIAGNOSIS — Z23 Encounter for immunization: Secondary | ICD-10-CM | POA: Insufficient documentation

## 2020-03-04 NOTE — Progress Notes (Signed)
   Covid-19 Vaccination Clinic  Name:  Sean Schwartz    MRN: [id number] DOB: [birth year]  03/04/2020  [name] was observed post Covid-19 immunization for 15 minutes without incident. He was provided with Vaccine Information Sheet and instruction to access the V-Safe system.   [name] was instructed to call 911 with any severe reactions post vaccine: Marland Kitchen Difficulty breathing  . Swelling of face and throat  . A fast heartbeat  . A bad rash all over body  . Dizziness and weakness   Immunizations Administered    Name Date Dose VIS Date Route   Pfizer COVID-19 Vaccine 03/04/2020  8:35 AM 0.3 mL 12/07/2019 Intramuscular   Manufacturer: [place]   Lot: [id number]   [place]: [id number]

## 2020-04-07 ENCOUNTER — Ambulatory Visit: Payer: Medicare Other | Admitting: Physician Assistant

## 2020-04-18 ENCOUNTER — Telehealth: Payer: Self-pay | Admitting: Physician Assistant

## 2020-04-18 ENCOUNTER — Ambulatory Visit: Payer: Medicare PPO | Admitting: Physician Assistant

## 2020-04-18 ENCOUNTER — Encounter: Payer: Self-pay | Admitting: Physician Assistant

## 2020-04-18 ENCOUNTER — Other Ambulatory Visit: Payer: Self-pay

## 2020-04-18 DIAGNOSIS — L719 Rosacea, unspecified: Secondary | ICD-10-CM | POA: Diagnosis not present

## 2020-04-18 DIAGNOSIS — L738 Other specified follicular disorders: Secondary | ICD-10-CM

## 2020-04-18 DIAGNOSIS — L570 Actinic keratosis: Secondary | ICD-10-CM

## 2020-04-18 MED ORDER — SULFACETAMIDE SODIUM (CLEANS) 10 % EX GEL: 1.0000 "application " | Freq: Once | CUTANEOUS | 1 refills | Status: AC

## 2020-04-18 MED ORDER — DOXYCYCLINE MONOHYDRATE 50 MG PO CAPS: 50.0000 mg | ORAL_CAPSULE | Freq: Once | ORAL | 3 refills | Status: AC

## 2020-04-18 MED ORDER — SULFACETAMIDE SODIUM (CLEANS) 10 % EX GEL: 1.0000 "application " | Freq: Once | CUTANEOUS | 2 refills | Status: AC

## 2020-04-18 MED ORDER — KETOCONAZOLE 2 % EX SHAM: 1.0000 "application " | MEDICATED_SHAMPOO | Freq: Once | CUTANEOUS | 1 refills | Status: AC

## 2020-04-18 NOTE — Telephone Encounter (Signed)
Sent patients prescription for the sulfaclense to the [facility].

## 2020-04-18 NOTE — Telephone Encounter (Signed)
[facility] stated the sulfacetamide is $600 and if we want to switch to something else

## 2020-04-18 NOTE — Telephone Encounter (Signed)
Pharmacist received prescription for patient and needs clarification on dosage. [id number]

## 2020-04-18 NOTE — Telephone Encounter (Signed)
Can we try to get Sulfa wash through [facility] instead?

## 2020-04-18 NOTE — Progress Notes (Signed)
   Follow up Visit  Subjective  Sean Schwartz is a 69 y.o. male who presents for the following: Annual Exam (skin check no concerns. needs refill on doxy for roascea. ). He feels his rosacea is under control. He takes 50mg  of Doxy qhs and increases his dose to 100mg  if he flares up. He has no spots he is concerned about.  Objective  Well appearing patient in no apparent distress; mood and affect are within normal limits.  All skin waist up examined not including scalp. No suspicious moles noted on back.   Objective  Right Hand - Posterior: Erythematous patches with gritty scale.  Objective  Mid Back: Inflamed papules back.  Objective  Left Malar Cheek, Right Malar Cheek: Mid face erythema with telangiectasias +/- scattered inflammatory papules.   Assessment & Plan  AK (actinic keratosis) Right Hand - Posterior  Destruction of lesion - Right Hand - Posterior Complexity: simple   Destruction method: cryotherapy   Informed consent: discussed and consent obtained   Timeout:  patient name, date of birth, surgical site, and procedure verified Lesion destroyed using liquid nitrogen: Yes   Outcome: patient tolerated procedure well with no complications    Pityrosporum folliculitis Mid Back  ketoconazole (NIZORAL) 2 % shampoo - Mid Back  Rosacea (2) Left Malar Cheek; Right Malar Cheek  Ordered Medications: doxycycline (MONODOX) 50 MG capsule Sulfacetamide Sodium 10 % (Cleans) GEL

## 2020-04-22 ENCOUNTER — Telehealth: Payer: Self-pay

## 2020-04-22 MED ORDER — SULFACETAMIDE SODIUM-SULFUR 8-4 % EX SUSP: 1.0000 "application " | Freq: Every day | CUTANEOUS | 3 refills | Status: DC

## 2020-04-22 NOTE — Telephone Encounter (Signed)
Fax from [facility] that the medication would not be covered. Per [employer] switch to [employer].  Sent to the pharmacy.

## 2020-11-11 ENCOUNTER — Encounter: Payer: Self-pay | Admitting: Family Medicine

## 2020-11-29 ENCOUNTER — Ambulatory Visit: Payer: Medicare PPO | Attending: Internal Medicine

## 2020-11-29 ENCOUNTER — Encounter: Payer: Self-pay | Admitting: Family Medicine

## 2020-11-29 DIAGNOSIS — Z23 Encounter for immunization: Secondary | ICD-10-CM

## 2020-11-29 NOTE — Progress Notes (Signed)
   Covid-19 Vaccination Clinic  Name:  Sean Schwartz    MRN: [id number] DOB: [birth year]  11/29/2020  [name] was observed post Covid-19 immunization for 15 minutes without incident. He was provided with Vaccine Information Sheet and instruction to access the V-Safe system.   [name] was instructed to call 911 with any severe reactions post vaccine: Marland Kitchen Difficulty breathing  . Swelling of face and throat  . A fast heartbeat  . A bad rash all over body  . Dizziness and weakness   Immunizations Administered    Name Date Dose VIS Date Route   Pfizer COVID-19 Vaccine 11/29/2020  9:11 AM 0.3 mL 10/15/2020 Intramuscular   Manufacturer: [place]   Lot: [id number]   NDC: [address]-6

## 2020-12-15 ENCOUNTER — Other Ambulatory Visit: Payer: Self-pay | Admitting: *Deleted

## 2020-12-15 ENCOUNTER — Telehealth: Payer: Self-pay | Admitting: *Deleted

## 2020-12-15 ENCOUNTER — Telehealth: Payer: Self-pay | Admitting: Family Medicine

## 2020-12-15 ENCOUNTER — Other Ambulatory Visit: Payer: Self-pay | Admitting: Family Medicine

## 2020-12-15 DIAGNOSIS — Z1321 Encounter for screening for nutritional disorder: Secondary | ICD-10-CM

## 2020-12-15 DIAGNOSIS — Z1329 Encounter for screening for other suspected endocrine disorder: Secondary | ICD-10-CM

## 2020-12-15 DIAGNOSIS — Z125 Encounter for screening for malignant neoplasm of prostate: Secondary | ICD-10-CM

## 2020-12-15 DIAGNOSIS — E7800 Pure hypercholesterolemia, unspecified: Secondary | ICD-10-CM

## 2020-12-15 DIAGNOSIS — Z130 Encounter for screening for diseases of the blood and blood-forming organs and certain disorders involving the immune mechanism: Secondary | ICD-10-CM

## 2020-12-15 DIAGNOSIS — I10 Essential (primary) hypertension: Secondary | ICD-10-CM

## 2020-12-15 MED ORDER — DOXYCYCLINE HYCLATE 100 MG PO TABS
100.0000 mg | ORAL_TABLET | Freq: Every day | ORAL | 0 refills | Status: DC
Start: 2020-12-15 — End: 2021-01-05

## 2020-12-15 MED ORDER — LISINOPRIL-HYDROCHLOROTHIAZIDE 20-25 MG PO TABS: 1.0000 | ORAL_TABLET | Freq: Every day | ORAL | 0 refills | Status: DC

## 2020-12-15 MED ORDER — ROSUVASTATIN CALCIUM 20 MG PO TABS: 20.0000 mg | ORAL_TABLET | Freq: Every day | ORAL | 0 refills | Status: DC

## 2020-12-15 NOTE — Telephone Encounter (Signed)
Patient called to schedule AWV with Sean Schwartz [facility] and Medicare insurances). Please advise at [phone].

## 2020-12-15 NOTE — Telephone Encounter (Signed)
Sean Schwartz,   Sean Schwartz is coming in for a TOC he would like to have labs done the week before.   I did refill his medications since he will be out before his appointment.  Thank You

## 2020-12-15 NOTE — Telephone Encounter (Signed)
I placed all his orders as future orders so he can come whenever. Thanks!

## 2020-12-16 ENCOUNTER — Encounter: Payer: Self-pay | Admitting: *Deleted

## 2021-01-05 ENCOUNTER — Ambulatory Visit: Payer: Self-pay | Admitting: Family Medicine

## 2021-01-05 VITALS — BP 145/76 | Ht 65.0 in | Wt 158.0 lb

## 2021-01-05 DIAGNOSIS — Z0000 Encounter for general adult medical examination without abnormal findings: Secondary | ICD-10-CM

## 2021-01-05 NOTE — Patient Instructions (Addendum)
Thank you for taking time to come for your Medicare Wellness Visit. I appreciate your ongoing commitment to your health goals. Please review the following plan we discussed and let me know if I can assist you in the future.  Leroy Kennedy LPN  Preventive Care 70 Years and Older, Male Preventive care refers to lifestyle choices and visits with your health care provider that can promote health and wellness. This includes:  A yearly physical exam. This is also called an annual wellness visit.  Regular dental and eye exams.  Immunizations.  Screening for certain conditions.  Healthy lifestyle choices, such as: ? Eating a healthy diet. ? Getting regular exercise. ? Not using drugs or products that contain nicotine and tobacco. ? Limiting alcohol use. What can I expect for my preventive care visit? Physical exam Your health care provider will check your:  Height and weight. These may be used to calculate your BMI (body mass index). BMI is a measurement that tells if you are at a healthy weight.  Heart rate and blood pressure.  Body temperature.  Skin for abnormal spots. Counseling Your health care provider may ask you questions about your:  Past medical problems.  Family's medical history.  Alcohol, tobacco, and drug use.  Emotional well-being.  Home life and relationship well-being.  Sexual activity.  Diet, exercise, and sleep habits.  History of falls.  Memory and ability to understand (cognition).  Work and work Statistician.  Access to firearms. What immunizations do I need? Vaccines are usually given at various ages, according to a schedule. Your health care provider will recommend vaccines for you based on your age, medical history, and lifestyle or other factors, such as travel or where you work.   What tests do I need? Blood tests  Lipid and cholesterol levels. These may be checked every 5 years, or more often depending on your overall health.  Hepatitis C  test.  Hepatitis B test. Screening  Lung cancer screening. You may have this screening every year starting at age 70 if you have a 30-pack-year history of smoking and currently smoke or have quit within the past 15 years.  Colorectal cancer screening. ? All adults should have this screening starting at age 70 and continuing until age 70. ? Your health care provider may recommend screening at age 70 if you are at increased risk. ? You will have tests every 1-10 years, depending on your results and the type of screening test.  Prostate cancer screening. Recommendations will vary depending on your family history and other risks.  Genital exam to check for testicular cancer or hernias.  Diabetes screening. ? This is done by checking your blood sugar (glucose) after you have not eaten for a while (fasting). ? You may have this done every 1-3 years.  Abdominal aortic aneurysm (AAA) screening. You may need this if you are a current or former smoker.  STD (sexually transmitted disease) testing, if you are at risk. Follow these instructions at home: Eating and drinking  Eat a diet that includes fresh fruits and vegetables, whole grains, lean protein, and low-fat dairy products. Limit your intake of foods with high amounts of sugar, saturated fats, and salt.  Take vitamin and mineral supplements as recommended by your health care provider.  Do not drink alcohol if your health care provider tells you not to drink.  If you drink alcohol: ? Limit how much you have to 0-2 drinks a day. ? Be aware of how much alcohol is  in your drink. In the [place] one drink equals one 12 oz bottle of beer (355 mL), one 5 oz glass of wine (148 mL), or one 1 oz glass of hard liquor (44 mL).   Lifestyle  Take daily care of your teeth and gums. Brush your teeth every morning and night with fluoride toothpaste. Floss one time each day.  Stay active. Exercise for at least 30 minutes 5 or more days each week.  Do  not use any products that contain nicotine or tobacco, such as cigarettes, e-cigarettes, and chewing tobacco. If you need help quitting, ask your health care provider.  Do not use drugs.  If you are sexually active, practice safe sex. Use a condom or other form of protection to prevent STIs (sexually transmitted infections).  Talk with your health care provider about taking a low-dose aspirin or statin.  Find healthy ways to cope with stress, such as: ? Meditation, yoga, or listening to music. ? Journaling. ? Talking to a trusted person. ? Spending time with friends and family. Safety  Always wear your seat belt while driving or riding in a vehicle.  Do not drive: ? If you have been drinking alcohol. Do not ride with someone who has been drinking. ? When you are tired or distracted. ? While texting.  Wear a helmet and other protective equipment during sports activities.  If you have firearms in your house, make sure you follow all gun safety procedures. What's next?  Visit your health care provider once a year for an annual wellness visit.  Ask your health care provider how often you should have your eyes and teeth checked.  Stay up to date on all vaccines. This information is not intended to replace advice given to you by your health care provider. Make sure you discuss any questions you have with your health care provider. Document Revised: 09/11/2019 Document Reviewed: 12/07/2018 Elsevier Patient Education  2021 [employer].
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
## 2021-01-12 ENCOUNTER — Encounter: Payer: Self-pay | Admitting: Family Medicine

## 2021-01-13 ENCOUNTER — Other Ambulatory Visit: Payer: Self-pay | Admitting: Emergency Medicine

## 2021-01-13 ENCOUNTER — Ambulatory Visit: Payer: Medicare PPO

## 2021-01-13 DIAGNOSIS — Z125 Encounter for screening for malignant neoplasm of prostate: Secondary | ICD-10-CM

## 2021-01-13 DIAGNOSIS — Z1321 Encounter for screening for nutritional disorder: Secondary | ICD-10-CM

## 2021-01-13 DIAGNOSIS — E7800 Pure hypercholesterolemia, unspecified: Secondary | ICD-10-CM

## 2021-01-13 DIAGNOSIS — Z13228 Encounter for screening for other metabolic disorders: Secondary | ICD-10-CM

## 2021-01-13 DIAGNOSIS — I10 Essential (primary) hypertension: Secondary | ICD-10-CM

## 2021-01-13 DIAGNOSIS — Z130 Encounter for screening for diseases of the blood and blood-forming organs and certain disorders involving the immune mechanism: Secondary | ICD-10-CM

## 2021-01-13 DIAGNOSIS — Z1329 Encounter for screening for other suspected endocrine disorder: Secondary | ICD-10-CM

## 2021-01-20 ENCOUNTER — Encounter: Payer: Self-pay | Admitting: Family Medicine

## 2021-01-20 ENCOUNTER — Other Ambulatory Visit: Payer: Self-pay

## 2021-01-20 ENCOUNTER — Ambulatory Visit: Payer: Medicare PPO | Admitting: Family Medicine

## 2021-01-20 VITALS — BP 140/80 | HR 81 | Temp 97.9°F | Ht 65.0 in | Wt 160.0 lb

## 2021-01-20 DIAGNOSIS — Z130 Encounter for screening for diseases of the blood and blood-forming organs and certain disorders involving the immune mechanism: Secondary | ICD-10-CM

## 2021-01-20 DIAGNOSIS — Z1329 Encounter for screening for other suspected endocrine disorder: Secondary | ICD-10-CM

## 2021-01-20 DIAGNOSIS — E7800 Pure hypercholesterolemia, unspecified: Secondary | ICD-10-CM | POA: Diagnosis not present

## 2021-01-20 DIAGNOSIS — D696 Thrombocytopenia, unspecified: Secondary | ICD-10-CM | POA: Insufficient documentation

## 2021-01-20 DIAGNOSIS — I10 Essential (primary) hypertension: Secondary | ICD-10-CM | POA: Diagnosis not present

## 2021-01-20 DIAGNOSIS — Z1321 Encounter for screening for nutritional disorder: Secondary | ICD-10-CM | POA: Diagnosis not present

## 2021-01-20 DIAGNOSIS — Z13228 Encounter for screening for other metabolic disorders: Secondary | ICD-10-CM

## 2021-01-20 DIAGNOSIS — Z125 Encounter for screening for malignant neoplasm of prostate: Secondary | ICD-10-CM

## 2021-01-20 MED ORDER — LISINOPRIL 10 MG PO TABS: 10.0000 mg | ORAL_TABLET | Freq: Every day | ORAL | 3 refills | Status: DC
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
## 2021-01-20 NOTE — Patient Instructions (Addendum)
  Health Maintenance After Age 70 After age 70, you are at a higher risk for certain long-term diseases and infections as well as injuries from falls. Falls are a major cause of broken bones and head injuries in people who are older than age 70. Getting regular preventive care can help to keep you healthy and well. Preventive care includes getting regular testing and making lifestyle changes as recommended by your health care provider. Talk with your health care provider about:  Which screenings and tests you should have. A screening is a test that checks for a disease when you have no symptoms.  A diet and exercise plan that is right for you. What should I know about screenings and tests to prevent falls? Screening and testing are the best ways to find a health problem early. Early diagnosis and treatment give you the best chance of managing medical conditions that are common after age 70. Certain conditions and lifestyle choices may make you more likely to have a fall. Your health care provider may recommend:  Regular vision checks. Poor vision and conditions such as cataracts can make you more likely to have a fall. If you wear glasses, make sure to get your prescription updated if your vision changes.  Medicine review. Work with your health care provider to regularly review all of the medicines you are taking, including over-the-counter medicines. Ask your health care provider about any side effects that may make you more likely to have a fall. Tell your health care provider if any medicines that you take make you feel dizzy or sleepy.  Osteoporosis screening. Osteoporosis is a condition that causes the bones to get weaker. This can make the bones weak and cause them to break more easily.  Blood pressure screening. Blood pressure changes and medicines to control blood pressure can make you feel dizzy.  Strength and balance checks. Your health care provider may recommend certain tests to  check your strength and balance while standing, walking, or changing positions.  Foot health exam. Foot pain and numbness, as well as not wearing proper footwear, can make you more likely to have a fall.  Depression screening. You may be more likely to have a fall if you have a fear of falling, feel emotionally low, or feel unable to do activities that you used to do.  Alcohol use screening. Using too much alcohol can affect your balance and may make you more likely to have a fall. What actions can I take to lower my risk of falls? General instructions  Talk with your health care provider about your risks for falling. Tell your health care provider if: ? You fall. Be sure to tell your health care provider about all falls, even ones that seem minor. ? You feel dizzy, sleepy, or off-balance.  Take over-the-counter and prescription medicines only as told by your health care provider. These include any supplements.  Eat a healthy diet and maintain a healthy weight. A healthy diet includes low-fat dairy products, low-fat (lean) meats, and fiber from whole grains, beans, and lots of fruits and vegetables. Home safety  Remove any tripping hazards, such as rugs, cords, and clutter.  Install safety equipment such as grab bars in bathrooms and safety rails on stairs.  Keep rooms and walkways well-lit. Activity  Follow a regular exercise program to stay fit. This will help you maintain your balance. Ask your health care provider what types of exercise are appropriate for you.  If you need a cane   or walker, use it as recommended by your health care provider.  Wear supportive shoes that have nonskid soles.   Lifestyle  Do not drink alcohol if your health care provider tells you not to drink.  If you drink alcohol, limit how much you have: ? 0-1 drink a day for women. ? 0-2 drinks a day for men.  Be aware of how much alcohol is in your drink. In the [place] one drink equals one typical bottle  of beer (12 oz), one-half glass of wine (5 oz), or one shot of hard liquor (1 oz).  Do not use any products that contain nicotine or tobacco, such as cigarettes and e-cigarettes. If you need help quitting, ask your health care provider. Summary  Having a healthy lifestyle and getting preventive care can help to protect your health and wellness after age 70.  Screening and testing are the best way to find a health problem early and help you avoid having a fall. Early diagnosis and treatment give you the best chance for managing medical conditions that are more common for people who are older than age 70.  Falls are a major cause of broken bones and head injuries in people who are older than age 70. Take precautions to prevent a fall at home.  Work with your health care provider to learn what changes you can make to improve your health and wellness and to prevent falls. This information is not intended to replace advice given to you by your health care provider. Make sure you discuss any questions you have with your health care provider. Document Revised: 04/05/2019 Document Reviewed: 10/26/2017 Elsevier Patient Education  2021 Elsevier Inc.   If you have lab work done today you will be contacted with your lab results within the next 2 weeks.  If you have not heard from us then please contact us. The fastest way to get your results is to register for My Chart.   IF you received an x-ray today, you will receive an invoice from [facility]. Please contact [facility] at [phone] with questions or concerns regarding your invoice.   IF you received labwork today, you will receive an invoice from [facility]. Please contact [facility] at [phone] with questions or concerns regarding your invoice.   Our billing staff will not be able to assist you with questions regarding bills from these companies.  You will be contacted with the lab results as soon as they are available.  The fastest way to get your results is to activate your My Chart account. Instructions are located on the last page of this paperwork. If you have not heard from us regarding the results in 2 weeks, please contact this office.      

## 2021-01-21 ENCOUNTER — Encounter: Payer: Self-pay | Admitting: Family Medicine

## 2021-01-21 LAB — CMP14+EGFR
ALT: 79 IU/L — ABNORMAL HIGH (ref 0–44)
AST: 49 IU/L — ABNORMAL HIGH (ref 0–40)
Albumin/Globulin Ratio: 2 (ref 1.2–2.2)
Albumin: 4.9 g/dL — ABNORMAL HIGH (ref 3.8–4.8)
Alkaline Phosphatase: 60 IU/L (ref 44–121)
BUN/Creatinine Ratio: 18 (ref 10–24)
BUN: 16 mg/dL (ref 8–27)
Bilirubin Total: 1.1 mg/dL (ref 0.0–1.2)
CO2: 22 mmol/L (ref 20–29)
Calcium: 9.9 mg/dL (ref 8.6–10.2)
Chloride: 96 mmol/L (ref 96–106)
Creatinine, Ser: 0.91 mg/dL (ref 0.76–1.27)
GFR calc Af Amer: 99 mL/min/{1.73_m2} (ref >59)
GFR calc non Af Amer: 86 mL/min/{1.73_m2} (ref >59)
Globulin, Total: 2.5 g/dL (ref 1.5–4.5)
Glucose: 97 mg/dL (ref 65–99)
Potassium: 4.5 mmol/L (ref 3.5–5.2)
Sodium: 135 mmol/L (ref 134–144)
Total Protein: 7.4 g/dL (ref 6.0–8.5)

## 2021-01-21 LAB — VITAMIN D 25 HYDROXY (VIT D DEFICIENCY, FRACTURES): Vit D, 25-Hydroxy: 32.1 ng/mL (ref 30.0–100.0)

## 2021-01-21 LAB — PSA: Prostate Specific Ag, Serum: 3.1 ng/mL (ref 0.0–4.0)

## 2021-01-21 LAB — CBC
Hematocrit: 50 % (ref 37.5–51.0)
Hemoglobin: 16.6 g/dL (ref 13.0–17.7)
MCH: 31.3 pg (ref 26.6–33.0)
MCHC: 33.2 g/dL (ref 31.5–35.7)
MCV: 94 fL (ref 79–97)
Platelets: 183 10*3/uL (ref 150–450)
RBC: 5.3 x10E6/uL (ref 4.14–5.80)
RDW: 11.8 % (ref 11.6–15.4)
WBC: 5.7 10*3/uL (ref 3.4–10.8)

## 2021-01-21 LAB — TSH: TSH: 4.32 u[IU]/mL (ref 0.450–4.500)

## 2021-01-21 LAB — HEMOGLOBIN A1C
Est. average glucose Bld gHb Est-mCnc: 103 mg/dL
Hgb A1c MFr Bld: 5.2 % (ref 4.8–5.6)

## 2021-01-21 LAB — LIPID PANEL
Chol/HDL Ratio: 3.7 ratio (ref 0.0–5.0)
Cholesterol, Total: 197 mg/dL (ref 100–199)
HDL: 53 mg/dL (ref >39)
LDL Chol Calc (NIH): 127 mg/dL — ABNORMAL HIGH (ref 0–99)
Triglycerides: 97 mg/dL (ref 0–149)
VLDL Cholesterol Cal: 17 mg/dL (ref 5–40)

## 2021-02-10 ENCOUNTER — Other Ambulatory Visit: Payer: Self-pay | Admitting: Family Medicine

## 2021-02-10 ENCOUNTER — Encounter: Payer: Self-pay | Admitting: Family Medicine

## 2021-02-10 DIAGNOSIS — I10 Essential (primary) hypertension: Secondary | ICD-10-CM

## 2021-02-10 MED ORDER — LISINOPRIL-HYDROCHLOROTHIAZIDE 20-12.5 MG PO TABS: 2.0000 | ORAL_TABLET | Freq: Every day | ORAL | 3 refills | Status: DC

## 2021-02-16 ENCOUNTER — Encounter: Payer: Self-pay | Admitting: Family Medicine

## 2021-02-16 ENCOUNTER — Other Ambulatory Visit: Payer: Self-pay

## 2021-02-16 ENCOUNTER — Other Ambulatory Visit: Payer: Self-pay | Admitting: Family Medicine

## 2021-02-16 DIAGNOSIS — E7800 Pure hypercholesterolemia, unspecified: Secondary | ICD-10-CM

## 2021-02-16 MED ORDER — ROSUVASTATIN CALCIUM 20 MG PO TABS: 20.0000 mg | ORAL_TABLET | Freq: Every day | ORAL | 1 refills | Status: DC

## 2021-02-16 MED ORDER — ROSUVASTATIN CALCIUM 20 MG PO TABS: 20.0000 mg | ORAL_TABLET | Freq: Every day | ORAL | 3 refills | Status: DC

## 2021-02-26 ENCOUNTER — Encounter: Payer: Self-pay | Admitting: Family Medicine

## 2021-02-26 ENCOUNTER — Telehealth: Payer: Self-pay | Admitting: Family Medicine

## 2021-02-26 ENCOUNTER — Other Ambulatory Visit: Payer: Self-pay | Admitting: *Deleted

## 2021-02-26 ENCOUNTER — Encounter: Payer: Self-pay | Admitting: *Deleted

## 2021-02-26 DIAGNOSIS — I10 Essential (primary) hypertension: Secondary | ICD-10-CM

## 2021-02-26 MED ORDER — LISINOPRIL-HYDROCHLOROTHIAZIDE 20-12.5 MG PO TABS: 2.0000 | ORAL_TABLET | Freq: Every day | ORAL | 3 refills | Status: DC

## 2021-02-26 NOTE — Telephone Encounter (Signed)
New prescription sent

## 2021-02-26 NOTE — Telephone Encounter (Signed)
Pt called and stated that his Rx for lisinopril-hydrochlorothiazide (ZESTORETIC) 20-12.5 MG tablet Is usually filled for the year but he only received 90 tabs and he takes two daily / he asked if the refills can be increased to 180 tabs instead of 90 so he has enough until his next annual visit/ please advise

## 2021-05-20 ENCOUNTER — Other Ambulatory Visit: Payer: Self-pay

## 2021-05-20 ENCOUNTER — Ambulatory Visit (INDEPENDENT_AMBULATORY_CARE_PROVIDER_SITE_OTHER): Payer: Medicare PPO | Admitting: Physician Assistant

## 2021-05-20 ENCOUNTER — Encounter: Payer: Self-pay | Admitting: Physician Assistant

## 2021-05-20 DIAGNOSIS — L821 Other seborrheic keratosis: Secondary | ICD-10-CM

## 2021-05-20 DIAGNOSIS — Z1283 Encounter for screening for malignant neoplasm of skin: Secondary | ICD-10-CM | POA: Diagnosis not present

## 2021-05-20 DIAGNOSIS — L719 Rosacea, unspecified: Secondary | ICD-10-CM | POA: Diagnosis not present

## 2021-05-20 DIAGNOSIS — L209 Atopic dermatitis, unspecified: Secondary | ICD-10-CM

## 2021-05-20 DIAGNOSIS — L814 Other melanin hyperpigmentation: Secondary | ICD-10-CM

## 2021-05-20 DIAGNOSIS — L578 Other skin changes due to chronic exposure to nonionizing radiation: Secondary | ICD-10-CM

## 2021-05-20 DIAGNOSIS — Z86018 Personal history of other benign neoplasm: Secondary | ICD-10-CM | POA: Diagnosis not present

## 2021-05-20 DIAGNOSIS — L219 Seborrheic dermatitis, unspecified: Secondary | ICD-10-CM | POA: Diagnosis not present

## 2021-05-20 MED ORDER — DOXYCYCLINE HYCLATE 50 MG PO CAPS: 50.0000 mg | ORAL_CAPSULE | Freq: Two times a day (BID) | ORAL | 2 refills | Status: DC

## 2021-05-20 MED ORDER — KETOCONAZOLE 2 % EX SHAM: MEDICATED_SHAMPOO | CUTANEOUS | 5 refills | Status: DC

## 2021-05-20 MED ORDER — TRIAMCINOLONE ACETONIDE 0.1 % EX CREA: 1.0000 "application " | TOPICAL_CREAM | Freq: Every day | CUTANEOUS | 1 refills | Status: DC

## 2021-05-20 NOTE — Progress Notes (Signed)
   Follow-Up Visit   Subjective  Sean Schwartz is a 70 y.o. male who presents for the following: Annual Exam (No new concerns. Personal history of atypical moles but non non mole skin cancers. Patient denies any family history of melanoma or non mole skin cancer). His eczema is flaring. Per chart review he was prescribed betamethasone cream and ketoconazole shampoo.   The following portions of the chart were reviewed this encounter and updated as appropriate:  Tobacco  Allergies  Meds  Problems  Med Hx  Surg Hx  Fam Hx      Objective  Well appearing patient in no apparent distress; mood and affect are within normal limits.  All skin waist up examined.  Objective  Head - Anterior (Face): Erythematous central face.  Objective  Scalp: Scale and erythema  Objective  Neck and chest: Thin scaly erythematous thin plaques.   Assessment & Plan  Rosacea Head - Anterior (Face)  doxycycline (VIBRAMYCIN) 50 MG capsule - Head - Anterior (Face)  Seborrheic dermatitis Scalp  ketoconazole (NIZORAL) 2 % shampoo - Scalp  Atopic dermatitis, unspecified type Neck and chest  Call if flares to get stronger topical.  triamcinolone cream (KENALOG) 0.1 % - Neck and chest  Lentigines - Scattered tan macules - Discussed due to sun exposure - Benign, observe - Call for any changes  Seborrheic Keratoses - Stuck-on, waxy, tan-brown papules and plaques  - Discussed benign etiology and prognosis. - Observe - Call for any changes  Actinic Damage - diffuse scaly erythematous macules with underlying dyspigmentation - Recommend daily broad spectrum sunscreen SPF 30+ to sun-exposed areas, reapply every 2 hours as needed.  - Call for new or changing lesions.  Skin cancer screening performed today. No atypical nevi noted today.   I, Hilery Wintle, PA-C, have reviewed all documentation's for this visit.  The documentation on 05/20/21 for the exam, diagnosis, procedures and  orders are all accurate and complete.

## 2021-06-22 ENCOUNTER — Telehealth: Payer: Self-pay | Admitting: Physician Assistant

## 2021-06-22 DIAGNOSIS — L719 Rosacea, unspecified: Secondary | ICD-10-CM

## 2021-06-22 MED ORDER — DOXYCYCLINE HYCLATE 50 MG PO CAPS: 50.0000 mg | ORAL_CAPSULE | Freq: Two times a day (BID) | ORAL | 2 refills | Status: DC

## 2021-06-22 NOTE — Telephone Encounter (Signed)
Left patient a message to call back.

## 2021-06-22 NOTE — Telephone Encounter (Signed)
Refill of Doxycycline sent to patients pharmacy.

## 2021-06-22 NOTE — Telephone Encounter (Signed)
Patient calling to make our office aware that [facility] reported this morning that they did not get the Rx for Doxy 50mg   1 PO BID.

## 2021-06-24 ENCOUNTER — Telehealth: Payer: Self-pay | Admitting: Physician Assistant

## 2021-06-24 DIAGNOSIS — L719 Rosacea, unspecified: Secondary | ICD-10-CM

## 2021-06-24 MED ORDER — DOXYCYCLINE HYCLATE 50 MG PO CAPS: 50.0000 mg | ORAL_CAPSULE | Freq: Two times a day (BID) | ORAL | 2 refills | Status: DC

## 2021-06-24 NOTE — Telephone Encounter (Signed)
Patient aware verbal was given due to 2 attempts at escribe with [place].

## 2021-06-24 NOTE — Telephone Encounter (Addendum)
Patient is calling to say that he still has not gotten his prescription for Doxycycline from [facility] and [facility] has told him that they have faxed the refill request-2 times yesterday.  Patient was told that the refill request was faxed back to [facility] on 06/22/2021 but he insists that [facility] has not received it.

## 2021-07-08 ENCOUNTER — Encounter: Payer: Self-pay | Admitting: Registered Nurse

## 2021-07-08 ENCOUNTER — Other Ambulatory Visit: Payer: Self-pay

## 2021-07-08 DIAGNOSIS — I10 Essential (primary) hypertension: Secondary | ICD-10-CM

## 2021-07-08 DIAGNOSIS — E7800 Pure hypercholesterolemia, unspecified: Secondary | ICD-10-CM

## 2021-07-08 MED ORDER — LISINOPRIL-HYDROCHLOROTHIAZIDE 20-12.5 MG PO TABS: 2.0000 | ORAL_TABLET | Freq: Every day | ORAL | 1 refills | Status: DC

## 2021-07-08 MED ORDER — ROSUVASTATIN CALCIUM 20 MG PO TABS: 20.0000 mg | ORAL_TABLET | Freq: Every day | ORAL | 1 refills | Status: DC

## 2021-07-20 ENCOUNTER — Ambulatory Visit: Payer: Self-pay | Admitting: Registered Nurse

## 2021-07-21 ENCOUNTER — Other Ambulatory Visit: Payer: Self-pay

## 2021-07-21 ENCOUNTER — Encounter: Payer: Self-pay | Admitting: Registered Nurse

## 2021-07-21 ENCOUNTER — Ambulatory Visit: Payer: Medicare PPO | Admitting: Registered Nurse

## 2021-07-21 VITALS — BP 142/80 | HR 74 | Temp 98.3°F | Resp 18 | Ht 65.0 in | Wt 163.2 lb

## 2021-07-21 DIAGNOSIS — I10 Essential (primary) hypertension: Secondary | ICD-10-CM

## 2021-07-21 DIAGNOSIS — E7800 Pure hypercholesterolemia, unspecified: Secondary | ICD-10-CM

## 2021-07-21 DIAGNOSIS — Z7689 Persons encountering health services in other specified circumstances: Secondary | ICD-10-CM | POA: Diagnosis not present

## 2021-07-21 LAB — LIPID PANEL
Cholesterol: 197 mg/dL (ref 0–200)
HDL: 50.7 mg/dL (ref >39.00)
LDL Cholesterol: 127 mg/dL — ABNORMAL HIGH (ref 0–99)
NonHDL: 146.28
Total CHOL/HDL Ratio: 4
Triglycerides: 94 mg/dL (ref 0.0–149.0)
VLDL: 18.8 mg/dL (ref 0.0–40.0)

## 2021-07-21 LAB — COMPREHENSIVE METABOLIC PANEL
ALT: 67 U/L — ABNORMAL HIGH (ref 0–53)
AST: 41 U/L — ABNORMAL HIGH (ref 0–37)
Albumin: 4.7 g/dL (ref 3.5–5.2)
Alkaline Phosphatase: 44 U/L (ref 39–117)
BUN: 15 mg/dL (ref 6–23)
CO2: 27 mEq/L (ref 19–32)
Calcium: 9.9 mg/dL (ref 8.4–10.5)
Chloride: 95 mEq/L — ABNORMAL LOW (ref 96–112)
Creatinine, Ser: 0.89 mg/dL (ref 0.40–1.50)
GFR: 86.92 mL/min (ref >60.00)
Glucose, Bld: 95 mg/dL (ref 70–99)
Potassium: 4.2 mEq/L (ref 3.5–5.1)
Sodium: 132 mEq/L — ABNORMAL LOW (ref 135–145)
Total Bilirubin: 1.3 mg/dL — ABNORMAL HIGH (ref 0.2–1.2)
Total Protein: 7.4 g/dL (ref 6.0–8.3)

## 2021-07-21 LAB — HEMOGLOBIN A1C: Hgb A1c MFr Bld: 5.3 % (ref 4.6–6.5)

## 2021-07-21 MED ORDER — ROSUVASTATIN CALCIUM 20 MG PO TABS: 20.0000 mg | ORAL_TABLET | Freq: Every day | ORAL | 1 refills | Status: DC

## 2021-07-21 MED ORDER — LISINOPRIL-HYDROCHLOROTHIAZIDE 20-12.5 MG PO TABS: 2.0000 | ORAL_TABLET | Freq: Every day | ORAL | 1 refills | Status: DC

## 2021-07-21 NOTE — Patient Instructions (Addendum)
[name] to meet you.   Glad things are going well - let's keep it that way if possible!  Labs today will be on Mychart probably by this evening.   We will check on [facility] dates and import to MyChart - if we can't find them, we'll ask that you bring them in next time we see you  Meds refilled x 6 mo  Thank you  [name]     If you have lab work done today you will be contacted with your lab results within the next 2 weeks.  If you have not heard from [place] then please contact us. The fastest way to get your results is to register for My Chart.   IF you received an x-ray today, you will receive an invoice from [facility]. Please contact [facility] at [phone] with questions or concerns regarding your invoice.   IF you received labwork today, you will receive an invoice from [facility]. Please contact [facility] at [phone] with questions or concerns regarding your invoice.   Our billing staff will not be able to assist you with questions regarding bills from these companies.  You will be contacted with the lab results as soon as they are available. The fastest way to get your results is to activate your My Chart account. Instructions are located on the last page of this paperwork. If you have not heard from [place] regarding the results in 2 weeks, please contact this office.
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
## 2021-10-18 NOTE — Telephone Encounter (Signed)
Acknowledged  Thank you  Rich

## 2021-11-21 ENCOUNTER — Encounter: Payer: Self-pay | Admitting: Physician Assistant

## 2021-11-21 DIAGNOSIS — L719 Rosacea, unspecified: Secondary | ICD-10-CM

## 2021-11-23 MED ORDER — DOXYCYCLINE HYCLATE 50 MG PO CAPS: 50.0000 mg | ORAL_CAPSULE | Freq: Two times a day (BID) | ORAL | 2 refills | Status: DC

## 2021-11-25 ENCOUNTER — Encounter: Payer: Self-pay | Admitting: Registered Nurse

## 2021-12-15 ENCOUNTER — Encounter: Payer: Self-pay | Admitting: Registered Nurse

## 2022-01-18 ENCOUNTER — Encounter: Payer: Self-pay | Admitting: Registered Nurse

## 2022-01-21 ENCOUNTER — Ambulatory Visit (INDEPENDENT_AMBULATORY_CARE_PROVIDER_SITE_OTHER): Payer: Medicare PPO

## 2022-01-21 DIAGNOSIS — Z0000 Encounter for general adult medical examination without abnormal findings: Secondary | ICD-10-CM

## 2022-01-21 NOTE — Patient Instructions (Signed)
Sean Schwartz , Thank you for taking time to come for your Medicare Wellness Visit. I appreciate your ongoing commitment to your health goals. Please review the following plan we discussed and let me know if I can assist you in the future.   Screening recommendations/referrals: Colonoscopy: 03/11/2016  due 2027 Recommended yearly ophthalmology/optometry visit for glaucoma screening and checkup Recommended yearly dental visit for hygiene and checkup  Vaccinations: Influenza vaccine: completed  Pneumococcal vaccine: completed  Tdap vaccine: 11/14/2015 Shingles vaccine: completed    Advanced directives: yes   Conditions/risks identified: none   Next appointment: CPE 01/22/2022  CPE Maximiano Coss, NP  Preventive Care 1 Years and Older, Male Preventive care refers to lifestyle choices and visits with your health care provider that can promote health and wellness. What does preventive care include? A yearly physical exam. This is also called an annual well check. Dental exams once or twice a year. Routine eye exams. Ask your health care provider how often you should have your eyes checked. Personal lifestyle choices, including: Daily care of your teeth and gums. Regular physical activity. Eating a healthy diet. Avoiding tobacco and drug use. Limiting alcohol use. Practicing safe sex. Taking low doses of aspirin every day. Taking vitamin and mineral supplements as recommended by your health care provider. What happens during an annual well check? The services and screenings done by your health care provider during your annual well check will depend on your age, overall health, lifestyle risk factors, and family history of disease. Counseling  Your health care provider may ask you questions about your: Alcohol use. Tobacco use. Drug use. Emotional well-being. Home and relationship well-being. Sexual activity. Eating habits. History of falls. Memory and ability to understand  (cognition). Work and work Statistician. Screening  You may have the following tests or measurements: Height, weight, and BMI. Blood pressure. Lipid and cholesterol levels. These may be checked every 5 years, or more frequently if you are over 21 years old. Skin check. Lung cancer screening. You may have this screening every year starting at age 59 if you have a 30-pack-year history of smoking and currently smoke or have quit within the past 15 years. Fecal occult blood test (FOBT) of the stool. You may have this test every year starting at age 80. Flexible sigmoidoscopy or colonoscopy. You may have a sigmoidoscopy every 5 years or a colonoscopy every 10 years starting at age 87. Prostate cancer screening. Recommendations will vary depending on your family history and other risks. Hepatitis C blood test. Hepatitis B blood test. Sexually transmitted disease (STD) testing. Diabetes screening. This is done by checking your blood sugar (glucose) after you have not eaten for a while (fasting). You may have this done every 1-3 years. Abdominal aortic aneurysm (AAA) screening. You may need this if you are a current or former smoker. Osteoporosis. You may be screened starting at age 69 if you are at high risk. Talk with your health care provider about your test results, treatment options, and if necessary, the need for more tests. Vaccines  Your health care provider may recommend certain vaccines, such as: Influenza vaccine. This is recommended every year. Tetanus, diphtheria, and acellular pertussis (Tdap, Td) vaccine. You may need a Td booster every 10 years. Zoster vaccine. You may need this after age 34. Pneumococcal 13-valent conjugate (PCV13) vaccine. One dose is recommended after age 88. Pneumococcal polysaccharide (PPSV23) vaccine. One dose is recommended after age 10. Talk to your health care provider about which screenings and vaccines  you need and how often you need them. This  information is not intended to replace advice given to you by your health care provider. Make sure you discuss any questions you have with your health care provider. Document Released: 01/09/2016 Document Revised: 09/01/2016 Document Reviewed: 10/14/2015 Elsevier Interactive Patient Education  2017 [employer] Prevention in the Home Falls can cause injuries. They can happen to people of all ages. There are many things you can do to make your home safe and to help prevent falls. What can I do on the outside of my home? Regularly fix the edges of walkways and driveways and fix any cracks. Remove anything that might make you trip as you walk through a door, such as a raised step or threshold. Trim any bushes or trees on the path to your home. Use bright outdoor lighting. Clear any walking paths of anything that might make someone trip, such as rocks or tools. Regularly check to see if handrails are loose or broken. Make sure that both sides of any steps have handrails. Any raised decks and porches should have guardrails on the edges. Have any leaves, snow, or ice cleared regularly. Use sand or salt on walking paths during winter. Clean up any spills in your garage right away. This includes oil or grease spills. What can I do in the bathroom? Use night lights. Install grab bars by the toilet and in the tub and shower. Do not use towel bars as grab bars. Use non-skid mats or decals in the tub or shower. If you need to sit down in the shower, use a plastic, non-slip stool. Keep the floor dry. Clean up any water that spills on the floor as soon as it happens. Remove soap buildup in the tub or shower regularly. Attach bath mats securely with double-sided non-slip rug tape. Do not have throw rugs and other things on the floor that can make you trip. What can I do in the bedroom? Use night lights. Make sure that you have a light by your bed that is easy to reach. Do not use any sheets or  blankets that are too big for your bed. They should not hang down onto the floor. Have a firm chair that has side arms. You can use this for support while you get dressed. Do not have throw rugs and other things on the floor that can make you trip. What can I do in the kitchen? Clean up any spills right away. Avoid walking on wet floors. Keep items that you use a lot in easy-to-reach places. If you need to reach something above you, use a strong step stool that has a grab bar. Keep electrical cords out of the way. Do not use floor polish or wax that makes floors slippery. If you must use wax, use non-skid floor wax. Do not have throw rugs and other things on the floor that can make you trip. What can I do with my stairs? Do not leave any items on the stairs. Make sure that there are handrails on both sides of the stairs and use them. Fix handrails that are broken or loose. Make sure that handrails are as long as the stairways. Check any carpeting to make sure that it is firmly attached to the stairs. Fix any carpet that is loose or worn. Avoid having throw rugs at the top or bottom of the stairs. If you do have throw rugs, attach them to the floor with carpet tape. Make sure  that you have a light switch at the top of the stairs and the bottom of the stairs. If you do not have them, ask someone to add them for you. What else can I do to help prevent falls? Wear shoes that: Do not have high heels. Have rubber bottoms. Are comfortable and fit you well. Are closed at the toe. Do not wear sandals. If you use a stepladder: Make sure that it is fully opened. Do not climb a closed stepladder. Make sure that both sides of the stepladder are locked into place. Ask someone to hold it for you, if possible. Clearly mark and make sure that you can see: Any grab bars or handrails. First and last steps. Where the edge of each step is. Use tools that help you move around (mobility aids) if they are  needed. These include: Canes. Walkers. Scooters. Crutches. Turn on the lights when you go into a dark area. Replace any light bulbs as soon as they burn out. Set up your furniture so you have a clear path. Avoid moving your furniture around. If any of your floors are uneven, fix them. If there are any pets around you, be aware of where they are. Review your medicines with your doctor. Some medicines can make you feel dizzy. This can increase your chance of falling. Ask your doctor what other things that you can do to help prevent falls. This information is not intended to replace advice given to you by your health care provider. Make sure you discuss any questions you have with your health care provider. Document Released: 10/09/2009 Document Revised: 05/20/2016 Document Reviewed: 01/17/2015 Elsevier Interactive Patient Education  2017 [employer].
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
## 2022-01-22 ENCOUNTER — Ambulatory Visit: Payer: Medicare PPO | Admitting: Registered Nurse

## 2022-01-22 ENCOUNTER — Encounter: Payer: Self-pay | Admitting: Registered Nurse

## 2022-01-22 VITALS — BP 142/70 | HR 79 | Temp 98.3°F | Resp 16 | Wt 164.0 lb

## 2022-01-22 DIAGNOSIS — E7800 Pure hypercholesterolemia, unspecified: Secondary | ICD-10-CM | POA: Diagnosis not present

## 2022-01-22 DIAGNOSIS — I10 Essential (primary) hypertension: Secondary | ICD-10-CM | POA: Diagnosis not present

## 2022-01-22 LAB — COMPREHENSIVE METABOLIC PANEL
ALT: 63 U/L — ABNORMAL HIGH (ref 0–53)
AST: 41 U/L — ABNORMAL HIGH (ref 0–37)
Albumin: 4.7 g/dL (ref 3.5–5.2)
Alkaline Phosphatase: 46 U/L (ref 39–117)
BUN: 17 mg/dL (ref 6–23)
CO2: 28 mEq/L (ref 19–32)
Calcium: 9.9 mg/dL (ref 8.4–10.5)
Chloride: 97 mEq/L (ref 96–112)
Creatinine, Ser: 0.95 mg/dL (ref 0.40–1.50)
GFR: 80.9 mL/min (ref >60.00)
Glucose, Bld: 94 mg/dL (ref 70–99)
Potassium: 4.4 mEq/L (ref 3.5–5.1)
Sodium: 135 mEq/L (ref 135–145)
Total Bilirubin: 1.2 mg/dL (ref 0.2–1.2)
Total Protein: 7.5 g/dL (ref 6.0–8.3)

## 2022-01-22 MED ORDER — ROSUVASTATIN CALCIUM 20 MG PO TABS: 20.0000 mg | ORAL_TABLET | Freq: Every day | ORAL | 1 refills | Status: DC

## 2022-01-22 MED ORDER — LISINOPRIL-HYDROCHLOROTHIAZIDE 20-25 MG PO TABS
1.0000 | ORAL_TABLET | Freq: Every day | ORAL | 3 refills | Status: DC
Start: 2022-01-22 — End: 2022-07-14

## 2022-01-22 NOTE — Patient Instructions (Signed)
[name] to see you  Small increase in BP med dosage.  Call if any concerns arise  See you in 6 mo  Thanks,  Denice Paradise
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
## 2022-02-12 NOTE — Progress Notes (Signed)
Can we abstract?  Thanks,  Denice Paradise

## 2022-02-17 LAB — HM DIABETES EYE EXAM

## 2022-03-28 ENCOUNTER — Encounter: Payer: Self-pay | Admitting: Registered Nurse

## 2022-04-20 ENCOUNTER — Other Ambulatory Visit: Payer: Self-pay | Admitting: Dermatology

## 2022-04-20 DIAGNOSIS — L719 Rosacea, unspecified: Secondary | ICD-10-CM

## 2022-04-20 MED ORDER — DOXYCYCLINE HYCLATE 50 MG PO CAPS: 50.0000 mg | ORAL_CAPSULE | Freq: Two times a day (BID) | ORAL | 0 refills | Status: DC

## 2022-04-20 NOTE — Addendum Note (Signed)
Addended by: Sheran Lawless on: 04/20/2022 09:44 AM ? ? Modules accepted: Orders ? ?

## 2022-04-20 NOTE — Telephone Encounter (Signed)
Patient is returning telephone call.  Patient is scheduled for 05/20/2022 and is requesting refill until that date. ?

## 2022-05-17 ENCOUNTER — Encounter: Payer: Self-pay | Admitting: Physician Assistant

## 2022-05-17 ENCOUNTER — Ambulatory Visit: Payer: Medicare PPO | Admitting: Physician Assistant

## 2022-05-17 DIAGNOSIS — L219 Seborrheic dermatitis, unspecified: Secondary | ICD-10-CM | POA: Diagnosis not present

## 2022-05-17 DIAGNOSIS — L719 Rosacea, unspecified: Secondary | ICD-10-CM | POA: Diagnosis not present

## 2022-05-17 DIAGNOSIS — Z1283 Encounter for screening for malignant neoplasm of skin: Secondary | ICD-10-CM | POA: Diagnosis not present

## 2022-05-17 MED ORDER — DOXYCYCLINE HYCLATE 50 MG PO CAPS: 50.0000 mg | ORAL_CAPSULE | Freq: Two times a day (BID) | ORAL | 6 refills | Status: DC

## 2022-05-17 MED ORDER — KETOCONAZOLE 2 % EX SHAM: MEDICATED_SHAMPOO | CUTANEOUS | 11 refills | Status: DC

## 2022-05-17 NOTE — Progress Notes (Signed)
   Follow-Up Visit   Subjective  Sean Schwartz is a 71 y.o. male who presents for the following: Annual Exam (Patient here today for yearly skin check, per patient check lesion on right cheek x 2 weeks no bleeding no pain. Patient also needs a refill on Doxycycline and Ketoconazole Shampoo. ).   The following portions of the chart were reviewed this encounter and updated as appropriate:  Tobacco  Allergies  Meds  Problems  Med Hx  Surg Hx  Fam Hx      Objective  Well appearing patient in no apparent distress; mood and affect are within normal limits.  All skin waist up examined.  No atypical nevi or signs of NMSC noted at the time of the visit.   Head - Anterior (Face) Centrifacial erythema without papules/pustules.   Scalp Erythematous plaques with scale.    Assessment & Plan  Rosacea Head - Anterior (Face)  doxycycline (VIBRAMYCIN) 50 MG capsule - Head - Anterior (Face) Take 1 capsule (50 mg total) by mouth 2 (two) times daily.  Seborrheic dermatitis Scalp  ketoconazole (NIZORAL) 2 % shampoo - Scalp Apply to scalp and let sit 3-5 minutes then rinse.  Encounter for screening for malignant neoplasm of skin  Yearly skin examinations    I, Abhi Moccia, PA-C, have reviewed all documentation's for this visit.  The documentation on 05/17/22 for the exam, diagnosis, procedures and orders are all accurate and complete.

## 2022-05-20 ENCOUNTER — Ambulatory Visit: Payer: Medicare PPO | Admitting: Physician Assistant

## 2022-07-13 DIAGNOSIS — H33032 Retinal detachment with giant retinal tear, left eye: Secondary | ICD-10-CM | POA: Diagnosis not present

## 2022-07-13 DIAGNOSIS — H33052 Total retinal detachment, left eye: Secondary | ICD-10-CM | POA: Diagnosis not present

## 2022-07-13 DIAGNOSIS — H2513 Age-related nuclear cataract, bilateral: Secondary | ICD-10-CM | POA: Diagnosis not present

## 2022-07-13 DIAGNOSIS — H43391 Other vitreous opacities, right eye: Secondary | ICD-10-CM | POA: Diagnosis not present

## 2022-07-13 DIAGNOSIS — H43813 Vitreous degeneration, bilateral: Secondary | ICD-10-CM | POA: Diagnosis not present

## 2022-07-14 ENCOUNTER — Other Ambulatory Visit: Payer: Self-pay

## 2022-07-14 ENCOUNTER — Encounter: Payer: Self-pay | Admitting: Registered Nurse

## 2022-07-14 ENCOUNTER — Telehealth: Payer: Self-pay | Admitting: Registered Nurse

## 2022-07-14 DIAGNOSIS — I10 Essential (primary) hypertension: Secondary | ICD-10-CM

## 2022-07-14 DIAGNOSIS — E7800 Pure hypercholesterolemia, unspecified: Secondary | ICD-10-CM

## 2022-07-14 MED ORDER — ROSUVASTATIN CALCIUM 20 MG PO TABS: 20.0000 mg | ORAL_TABLET | Freq: Every day | ORAL | 1 refills | Status: DC

## 2022-07-14 MED ORDER — LISINOPRIL-HYDROCHLOROTHIAZIDE 20-25 MG PO TABS: 1.0000 | ORAL_TABLET | Freq: Every day | ORAL | 1 refills | Status: DC

## 2022-07-14 NOTE — Telephone Encounter (Signed)
(  FYI) This is a current pt of Richard. PT knows that Dr. Birdie Riddle isn't taking any new patients at this time. Pt would love to become one of Dr.Tabori's patients, so if she decide to take on new patients in future. Can someone notify pt?

## 2022-07-14 NOTE — Telephone Encounter (Signed)
At this time I am not taking any new patients other than family members and physician referrals.

## 2022-07-16 DIAGNOSIS — H25043 Posterior subcapsular polar age-related cataract, bilateral: Secondary | ICD-10-CM | POA: Diagnosis not present

## 2022-07-16 DIAGNOSIS — H33052 Total retinal detachment, left eye: Secondary | ICD-10-CM | POA: Diagnosis not present

## 2022-07-16 DIAGNOSIS — H2513 Age-related nuclear cataract, bilateral: Secondary | ICD-10-CM | POA: Diagnosis not present

## 2022-07-16 DIAGNOSIS — H25013 Cortical age-related cataract, bilateral: Secondary | ICD-10-CM | POA: Diagnosis not present

## 2022-07-16 DIAGNOSIS — H18413 Arcus senilis, bilateral: Secondary | ICD-10-CM | POA: Diagnosis not present

## 2022-07-16 DIAGNOSIS — Z9889 Other specified postprocedural states: Secondary | ICD-10-CM | POA: Diagnosis not present

## 2022-07-19 DIAGNOSIS — H2512 Age-related nuclear cataract, left eye: Secondary | ICD-10-CM | POA: Diagnosis not present

## 2022-07-19 DIAGNOSIS — H33052 Total retinal detachment, left eye: Secondary | ICD-10-CM | POA: Diagnosis not present

## 2022-07-20 DIAGNOSIS — H59812 Chorioretinal scars after surgery for detachment, left eye: Secondary | ICD-10-CM | POA: Diagnosis not present

## 2022-07-22 ENCOUNTER — Ambulatory Visit: Payer: Medicare PPO | Admitting: Registered Nurse

## 2022-07-23 ENCOUNTER — Ambulatory Visit: Payer: Medicare PPO | Admitting: Registered Nurse

## 2022-07-27 DIAGNOSIS — H59812 Chorioretinal scars after surgery for detachment, left eye: Secondary | ICD-10-CM | POA: Diagnosis not present

## 2022-07-27 DIAGNOSIS — H33052 Total retinal detachment, left eye: Secondary | ICD-10-CM | POA: Diagnosis not present

## 2022-08-17 DIAGNOSIS — H43811 Vitreous degeneration, right eye: Secondary | ICD-10-CM | POA: Diagnosis not present

## 2022-08-17 DIAGNOSIS — T85398D Other mechanical complication of other ocular prosthetic devices, implants and grafts, subsequent encounter: Secondary | ICD-10-CM | POA: Diagnosis not present

## 2022-08-17 DIAGNOSIS — H59812 Chorioretinal scars after surgery for detachment, left eye: Secondary | ICD-10-CM | POA: Diagnosis not present

## 2022-10-24 ENCOUNTER — Encounter: Payer: Self-pay | Admitting: Family Medicine
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
## 2022-10-27 ENCOUNTER — Encounter: Payer: Self-pay | Admitting: Family Medicine

## 2022-10-27 ENCOUNTER — Ambulatory Visit: Payer: Medicare PPO | Admitting: Family Medicine

## 2022-10-27 VITALS — BP 120/70 | HR 100 | Temp 98.1°F | Resp 16 | Ht 65.0 in | Wt 166.0 lb

## 2022-10-27 DIAGNOSIS — E7800 Pure hypercholesterolemia, unspecified: Secondary | ICD-10-CM | POA: Diagnosis not present

## 2022-10-27 DIAGNOSIS — I10 Essential (primary) hypertension: Secondary | ICD-10-CM | POA: Diagnosis not present

## 2022-10-27 DIAGNOSIS — L719 Rosacea, unspecified: Secondary | ICD-10-CM | POA: Diagnosis not present

## 2022-10-27 NOTE — Assessment & Plan Note (Signed)
BP adequately controlled. Continue lisinopril-HCTZ 20-25 mg daily and low-salt diet. Continue monitoring BP regularly. Eye exam is current.  He prefers to wait for blood work until his physical, which is scheduled for 12/2022.

## 2022-10-27 NOTE — Assessment & Plan Note (Signed)
Problem has improved with current management, Doxycycline 50 mg daily. His dermatologist closed her practice, so new referral placed.

## 2022-10-27 NOTE — Patient Instructions (Addendum)
A few things to remember from today's visit:  Rosacea - Plan: Ambulatory referral to Dermatology  Essential hypertension  Pure hypercholesterolemia  No changes today. We can have labs done next 12/2022, fasting and then kidney test every 6 months.  If you need refills for medications you take chronically, please call your pharmacy. Do not use My Chart to request refills or for acute issues that need immediate attention. If you send a my chart message, it may take a few days to be addressed, specially if I am not in the office.  Please be sure medication list is accurate. If a new problem present, please set up appointment sooner than planned today.

## 2022-10-27 NOTE — Assessment & Plan Note (Signed)
Continue Rosuvastatin 20 mg daily and low fat diet. Will plan on FLP next visit in 12/2022.

## 2022-11-22 ENCOUNTER — Telehealth: Payer: Self-pay | Admitting: *Deleted

## 2022-11-22 NOTE — Patient Outreach (Signed)
  Care Coordination   11/22/2022 Name: Sean Schwartz MRN: [id number] DOB: 10-09-1951   Care Coordination Outreach Attempts:  An unsuccessful telephone outreach was attempted today to offer the patient information about available care coordination services as a benefit of their health plan.   Follow Up Plan:  Additional outreach attempts will be made to offer the patient care coordination information and services.   Encounter Outcome:  No Answer   Care Coordination Interventions:  No, not indicated    Raina Mina, RN Care Management Coordinator [place] Office [phone]

## 2022-12-21 ENCOUNTER — Telehealth: Payer: Self-pay | Admitting: *Deleted

## 2022-12-21 NOTE — Patient Outreach (Signed)
  Care Coordination   12/21/2022 Name: SAMPSON SELF MRN: [id number] DOB: May 24, 1951   Care Coordination Outreach Attempts:  An unsuccessful telephone outreach was attempted today to offer the patient information about available care coordination services as a benefit of their health plan.   Follow Up Plan:  Additional outreach attempts will be made to offer the patient care coordination information and services.   Encounter Outcome:  No Answer   Care Coordination Interventions:  No, not indicated    Raina Mina, RN Care Management Coordinator [place] Office [phone]
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
## 2023-01-24 ENCOUNTER — Encounter: Payer: Self-pay | Admitting: Family Medicine

## 2023-01-24 ENCOUNTER — Ambulatory Visit (INDEPENDENT_AMBULATORY_CARE_PROVIDER_SITE_OTHER): Payer: Medicare PPO | Admitting: Family Medicine

## 2023-01-24 VITALS — BP 124/70 | HR 90 | Temp 98.3°F | Resp 16 | Ht 65.0 in | Wt 165.4 lb

## 2023-01-24 DIAGNOSIS — I10 Essential (primary) hypertension: Secondary | ICD-10-CM

## 2023-01-24 DIAGNOSIS — Z0000 Encounter for general adult medical examination without abnormal findings: Secondary | ICD-10-CM

## 2023-01-24 DIAGNOSIS — D696 Thrombocytopenia, unspecified: Secondary | ICD-10-CM | POA: Diagnosis not present

## 2023-01-24 DIAGNOSIS — N401 Enlarged prostate with lower urinary tract symptoms: Secondary | ICD-10-CM

## 2023-01-24 DIAGNOSIS — E7800 Pure hypercholesterolemia, unspecified: Secondary | ICD-10-CM

## 2023-01-24 DIAGNOSIS — R351 Nocturia: Secondary | ICD-10-CM

## 2023-01-24 LAB — TSH: TSH: 5.86 u[IU]/mL — ABNORMAL HIGH (ref 0.35–5.50)

## 2023-01-24 LAB — CBC WITH DIFFERENTIAL/PLATELET
Basophils Absolute: 0.1 10*3/uL (ref 0.0–0.1)
Basophils Relative: 1 % (ref 0.0–3.0)
Eosinophils Absolute: 0.2 10*3/uL (ref 0.0–0.7)
Eosinophils Relative: 2.3 % (ref 0.0–5.0)
HCT: 45.4 % (ref 39.0–52.0)
Hemoglobin: 15.7 g/dL (ref 13.0–17.0)
Lymphocytes Relative: 13.2 % (ref 12.0–46.0)
Lymphs Abs: 1 10*3/uL (ref 0.7–4.0)
MCHC: 34.6 g/dL (ref 30.0–36.0)
MCV: 94.9 fl (ref 78.0–100.0)
Monocytes Absolute: 0.9 10*3/uL (ref 0.1–1.0)
Monocytes Relative: 12.8 % — ABNORMAL HIGH (ref 3.0–12.0)
Neutro Abs: 5.2 10*3/uL (ref 1.4–7.7)
Neutrophils Relative %: 70.7 % (ref 43.0–77.0)
Platelets: 200 10*3/uL (ref 150.0–400.0)
RBC: 4.79 Mil/uL (ref 4.22–5.81)
RDW: 12.6 % (ref 11.5–15.5)
WBC: 7.3 10*3/uL (ref 4.0–10.5)

## 2023-01-24 LAB — COMPREHENSIVE METABOLIC PANEL
ALT: 51 U/L (ref 0–53)
AST: 35 U/L (ref 0–37)
Albumin: 4.8 g/dL (ref 3.5–5.2)
Alkaline Phosphatase: 54 U/L (ref 39–117)
BUN: 15 mg/dL (ref 6–23)
CO2: 27 mEq/L (ref 19–32)
Calcium: 10 mg/dL (ref 8.4–10.5)
Chloride: 95 mEq/L — ABNORMAL LOW (ref 96–112)
Creatinine, Ser: 0.9 mg/dL (ref 0.40–1.50)
GFR: 85.72 mL/min (ref >60.00)
Glucose, Bld: 94 mg/dL (ref 70–99)
Potassium: 4.1 mEq/L (ref 3.5–5.1)
Sodium: 134 mEq/L — ABNORMAL LOW (ref 135–145)
Total Bilirubin: 1.3 mg/dL — ABNORMAL HIGH (ref 0.2–1.2)
Total Protein: 7.6 g/dL (ref 6.0–8.3)

## 2023-01-24 LAB — LIPID PANEL
Cholesterol: 193 mg/dL (ref 0–200)
HDL: 49.6 mg/dL (ref >39.00)
LDL Cholesterol: 124 mg/dL — ABNORMAL HIGH (ref 0–99)
NonHDL: 143.11
Total CHOL/HDL Ratio: 4
Triglycerides: 96 mg/dL (ref 0.0–149.0)
VLDL: 19.2 mg/dL (ref 0.0–40.0)

## 2023-01-24 LAB — PSA: PSA: 3.89 ng/mL (ref 0.10–4.00)

## 2023-01-24 MED ORDER — LISINOPRIL-HYDROCHLOROTHIAZIDE 20-25 MG PO TABS: 1.0000 | ORAL_TABLET | Freq: Every day | ORAL | 2 refills | Status: DC

## 2023-01-24 NOTE — Patient Instructions (Addendum)
A few things to remember from today's visit:  Routine general medical examination at a health care facility  Pure hypercholesterolemia - Plan: Comprehensive metabolic panel, Lipid panel  Essential (primary) hypertension - Plan: Comprehensive metabolic panel, TSH  Thrombocytopenia (HCC) - Plan: CBC with Differential/Platelet  BPH associated with nocturia - Plan: PSA  Essential hypertension - Plan: lisinopril-hydrochlorothiazide (ZESTORETIC) 20-25 MG tablet  If you need refills for medications you take chronically, please call your pharmacy. Do not use My Chart to request refills or for acute issues that need immediate attention. If you send a my chart message, it may take a few days to be addressed, specially if I am not in the office.  Please be sure medication list is accurate. If a new problem present, please set up appointment sooner than planned today.

## 2023-01-24 NOTE — Assessment & Plan Note (Signed)
Platelets in 12/2020 183,000. CBC ordered today.

## 2023-01-24 NOTE — Assessment & Plan Note (Addendum)
We discussed the importance of regular physical activity and healthy diet for prevention of chronic illness and/or complications. Preventive guidelines reviewed. Vaccination up-to-date. Colonoscopy due in 02/2026. Next CPE in a year.

## 2023-01-24 NOTE — Assessment & Plan Note (Signed)
BP adequately controlled. Continue lisinopril-HCTZ 20-25 mg daily and low-salt diet.

## 2023-01-24 NOTE — Assessment & Plan Note (Signed)
Continue rosuvastatin 20 mg daily and low-fat diet. Further recommendation will be given according to lipid panel result.

## 2023-01-29 ENCOUNTER — Other Ambulatory Visit: Payer: Self-pay | Admitting: Family Medicine

## 2023-01-29 DIAGNOSIS — E7800 Pure hypercholesterolemia, unspecified: Secondary | ICD-10-CM

## 2023-01-29 MED ORDER — ROSUVASTATIN CALCIUM 20 MG PO TABS: 20.0000 mg | ORAL_TABLET | Freq: Every day | ORAL | 3 refills | Status: DC

## 2023-01-30 ENCOUNTER — Encounter: Payer: Self-pay | Admitting: Family Medicine

## 2023-02-04 ENCOUNTER — Ambulatory Visit (INDEPENDENT_AMBULATORY_CARE_PROVIDER_SITE_OTHER): Payer: Medicare PPO

## 2023-02-04 VITALS — Ht 65.0 in | Wt 165.0 lb

## 2023-02-04 DIAGNOSIS — Z0000 Encounter for general adult medical examination without abnormal findings: Secondary | ICD-10-CM | POA: Diagnosis not present

## 2023-02-04 NOTE — Patient Instructions (Addendum)
Sean Schwartz , Thank you for taking time to come for your Medicare Wellness Visit. I appreciate your ongoing commitment to your health goals. Please review the following plan we discussed and let me know if I can assist you in the future.   These are the goals we discussed:  Goals       Lose weight (pt-stated)      I would like to lose a few pounds.      Patient Stated      Would like to get to 155lb.        This is a list of the screening recommended for you and due dates:  Health Maintenance  Topic Date Due   COVID-19 Vaccine (4 - 2023-24 season) 02/09/2023*   Medicare Annual Wellness Visit  02/05/2024   DTaP/Tdap/Td vaccine (4 - Td or Tdap) 11/13/2025   Colon Cancer Screening  03/11/2026   Pneumonia Vaccine  Completed   Flu Shot  Completed   Hepatitis C Screening: USPSTF Recommendation to screen - Ages 18-79 yo.  Completed   Zoster (Shingles) Vaccine  Completed   HPV Vaccine  Aged Out  *Topic was postponed. The date shown is not the original due date.    Advanced directives: In Chart  Conditions/risks identified: None  Next appointment: Follow up in one year for your annual wellness visit.    Preventive Care 37 Years and Older, Male  Preventive care refers to lifestyle choices and visits with your health care provider that can promote health and wellness. What does preventive care include? A yearly physical exam. This is also called an annual well check. Dental exams once or twice a year. Routine eye exams. Ask your health care provider how often you should have your eyes checked. Personal lifestyle choices, including: Daily care of your teeth and gums. Regular physical activity. Eating a healthy diet. Avoiding tobacco and drug use. Limiting alcohol use. Practicing safe sex. Taking low doses of aspirin every day. Taking vitamin and mineral supplements as recommended by your health care provider. What happens during an annual well check? The services and  screenings done by your health care provider during your annual well check will depend on your age, overall health, lifestyle risk factors, and family history of disease. Counseling  Your health care provider may ask you questions about your: Alcohol use. Tobacco use. Drug use. Emotional well-being. Home and relationship well-being. Sexual activity. Eating habits. History of falls. Memory and ability to understand (cognition). Work and work Statistician. Screening  You may have the following tests or measurements: Height, weight, and BMI. Blood pressure. Lipid and cholesterol levels. These may be checked every 5 years, or more frequently if you are over 72 years old. Skin check. Lung cancer screening. You may have this screening every year starting at age 68 if you have a 30-pack-year history of smoking and currently smoke or have quit within the past 15 years. Fecal occult blood test (FOBT) of the stool. You may have this test every year starting at age 74. Flexible sigmoidoscopy or colonoscopy. You may have a sigmoidoscopy every 5 years or a colonoscopy every 10 years starting at age 31. Prostate cancer screening. Recommendations will vary depending on your family history and other risks. Hepatitis C blood test. Hepatitis B blood test. Sexually transmitted disease (STD) testing. Diabetes screening. This is done by checking your blood sugar (glucose) after you have not eaten for a while (fasting). You may have this done every 1-3 years. Abdominal aortic aneurysm (  AAA) screening. You may need this if you are a current or former smoker. Osteoporosis. You may be screened starting at age 49 if you are at high risk. Talk with your health care provider about your test results, treatment options, and if necessary, the need for more tests. Vaccines  Your health care provider may recommend certain vaccines, such as: Influenza vaccine. This is recommended every year. Tetanus, diphtheria, and  acellular pertussis (Tdap, Td) vaccine. You may need a Td booster every 10 years. Zoster vaccine. You may need this after age 37. Pneumococcal 13-valent conjugate (PCV13) vaccine. One dose is recommended after age 53. Pneumococcal polysaccharide (PPSV23) vaccine. One dose is recommended after age 42. Talk to your health care provider about which screenings and vaccines you need and how often you need them. This information is not intended to replace advice given to you by your health care provider. Make sure you discuss any questions you have with your health care provider. Document Released: 01/09/2016 Document Revised: 09/01/2016 Document Reviewed: 10/14/2015 Elsevier Interactive Patient Education  2017 [place] Prevention in the Home Falls can cause injuries. They can happen to people of all ages. There are many things you can do to make your home safe and to help prevent falls. What can I do on the outside of my home? Regularly fix the edges of walkways and driveways and fix any cracks. Remove anything that might make you trip as you walk through a door, such as a raised step or threshold. Trim any bushes or trees on the path to your home. Use bright outdoor lighting. Clear any walking paths of anything that might make someone trip, such as rocks or tools. Regularly check to see if handrails are loose or broken. Make sure that both sides of any steps have handrails. Any raised decks and porches should have guardrails on the edges. Have any leaves, snow, or ice cleared regularly. Use sand or salt on walking paths during winter. Clean up any spills in your garage right away. This includes oil or grease spills. What can I do in the bathroom? Use night lights. Install grab bars by the toilet and in the tub and shower. Do not use towel bars as grab bars. Use non-skid mats or decals in the tub or shower. If you need to sit down in the shower, use a plastic, non-slip stool. Keep  the floor dry. Clean up any water that spills on the floor as soon as it happens. Remove soap buildup in the tub or shower regularly. Attach bath mats securely with double-sided non-slip rug tape. Do not have throw rugs and other things on the floor that can make you trip. What can I do in the bedroom? Use night lights. Make sure that you have a light by your bed that is easy to reach. Do not use any sheets or blankets that are too big for your bed. They should not hang down onto the floor. Have a firm chair that has side arms. You can use this for support while you get dressed. Do not have throw rugs and other things on the floor that can make you trip. What can I do in the kitchen? Clean up any spills right away. Avoid walking on wet floors. Keep items that you use a lot in easy-to-reach places. If you need to reach something above you, use a strong step stool that has a grab bar. Keep electrical cords out of the way. Do not use floor polish  or wax that makes floors slippery. If you must use wax, use non-skid floor wax. Do not have throw rugs and other things on the floor that can make you trip. What can I do with my stairs? Do not leave any items on the stairs. Make sure that there are handrails on both sides of the stairs and use them. Fix handrails that are broken or loose. Make sure that handrails are as long as the stairways. Check any carpeting to make sure that it is firmly attached to the stairs. Fix any carpet that is loose or worn. Avoid having throw rugs at the top or bottom of the stairs. If you do have throw rugs, attach them to the floor with carpet tape. Make sure that you have a light switch at the top of the stairs and the bottom of the stairs. If you do not have them, ask someone to add them for you. What else can I do to help prevent falls? Wear shoes that: Do not have high heels. Have rubber bottoms. Are comfortable and fit you well. Are closed at the toe. Do not  wear sandals. If you use a stepladder: Make sure that it is fully opened. Do not climb a closed stepladder. Make sure that both sides of the stepladder are locked into place. Ask someone to hold it for you, if possible. Clearly mark and make sure that you can see: Any grab bars or handrails. First and last steps. Where the edge of each step is. Use tools that help you move around (mobility aids) if they are needed. These include: Canes. Walkers. Scooters. Crutches. Turn on the lights when you go into a dark area. Replace any light bulbs as soon as they burn out. Set up your furniture so you have a clear path. Avoid moving your furniture around. If any of your floors are uneven, fix them. If there are any pets around you, be aware of where they are. Review your medicines with your doctor. Some medicines can make you feel dizzy. This can increase your chance of falling. Ask your doctor what other things that you can do to help prevent falls. This information is not intended to replace advice given to you by your health care provider. Make sure you discuss any questions you have with your health care provider. Document Released: 10/09/2009 Document Revised: 05/20/2016 Document Reviewed: 01/17/2015 Elsevier Interactive Patient Education  2017 [employer].
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
## 2023-02-25 ENCOUNTER — Encounter: Payer: Self-pay | Admitting: Family Medicine

## 2023-02-25 DIAGNOSIS — E7800 Pure hypercholesterolemia, unspecified: Secondary | ICD-10-CM

## 2023-02-25 MED ORDER — ROSUVASTATIN CALCIUM 20 MG PO TABS: 20.0000 mg | ORAL_TABLET | Freq: Every day | ORAL | 3 refills | Status: DC

## 2023-04-06 DIAGNOSIS — H33312 Horseshoe tear of retina without detachment, left eye: Secondary | ICD-10-CM | POA: Diagnosis not present

## 2023-05-18 ENCOUNTER — Other Ambulatory Visit: Payer: Self-pay

## 2023-05-18 DIAGNOSIS — L719 Rosacea, unspecified: Secondary | ICD-10-CM

## 2023-05-18 DIAGNOSIS — L219 Seborrheic dermatitis, unspecified: Secondary | ICD-10-CM

## 2023-05-18 DIAGNOSIS — L209 Atopic dermatitis, unspecified: Secondary | ICD-10-CM

## 2023-05-20 ENCOUNTER — Other Ambulatory Visit: Payer: Self-pay | Admitting: Family Medicine

## 2023-05-20 DIAGNOSIS — L219 Seborrheic dermatitis, unspecified: Secondary | ICD-10-CM

## 2023-05-20 DIAGNOSIS — L719 Rosacea, unspecified: Secondary | ICD-10-CM

## 2023-05-20 DIAGNOSIS — L209 Atopic dermatitis, unspecified: Secondary | ICD-10-CM

## 2023-07-18 ENCOUNTER — Encounter: Payer: Self-pay | Admitting: Family Medicine
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
## 2023-07-26 ENCOUNTER — Ambulatory Visit: Payer: Medicare PPO | Admitting: Family Medicine

## 2023-07-26 ENCOUNTER — Encounter: Payer: Self-pay | Admitting: Family Medicine

## 2023-07-26 VITALS — BP 128/78 | HR 85 | Temp 98.6°F | Resp 16 | Ht 65.0 in | Wt 163.4 lb

## 2023-07-26 DIAGNOSIS — R7401 Elevation of levels of liver transaminase levels: Secondary | ICD-10-CM | POA: Diagnosis not present

## 2023-07-26 DIAGNOSIS — E7800 Pure hypercholesterolemia, unspecified: Secondary | ICD-10-CM

## 2023-07-26 DIAGNOSIS — I10 Essential (primary) hypertension: Secondary | ICD-10-CM | POA: Diagnosis not present

## 2023-07-26 LAB — HEPATIC FUNCTION PANEL
ALT: 57 U/L — ABNORMAL HIGH (ref 0–53)
AST: 46 U/L — ABNORMAL HIGH (ref 0–37)
Albumin: 4.6 g/dL (ref 3.5–5.2)
Alkaline Phosphatase: 47 U/L (ref 39–117)
Bilirubin, Direct: 0.3 mg/dL (ref 0.0–0.3)
Total Bilirubin: 1.5 mg/dL — ABNORMAL HIGH (ref 0.2–1.2)
Total Protein: 7.6 g/dL (ref 6.0–8.3)

## 2023-07-26 LAB — BASIC METABOLIC PANEL
BUN: 13 mg/dL (ref 6–23)
CO2: 26 mEq/L (ref 19–32)
Calcium: 10 mg/dL (ref 8.4–10.5)
Chloride: 95 mEq/L — ABNORMAL LOW (ref 96–112)
Creatinine, Ser: 0.88 mg/dL (ref 0.40–1.50)
GFR: 86 mL/min (ref >60.00)
Glucose, Bld: 109 mg/dL — ABNORMAL HIGH (ref 70–99)
Potassium: 3.9 mEq/L (ref 3.5–5.1)
Sodium: 130 mEq/L — ABNORMAL LOW (ref 135–145)

## 2023-07-26 MED ORDER — LISINOPRIL-HYDROCHLOROTHIAZIDE 20-25 MG PO TABS
1.0000 | ORAL_TABLET | Freq: Every day | ORAL | 2 refills | Status: DC
Start: 2023-07-26 — End: 2024-01-30

## 2023-07-26 NOTE — Assessment & Plan Note (Addendum)
BP adequately controlled today and home readings 110's-120's/60-70's. Continue lisinopril-HCTZ 20-25 mg daily and low-salt diet. Eye exam is current. Continue monitoring BP regularly. Follow-up in 6 months.

## 2023-07-26 NOTE — Assessment & Plan Note (Signed)
Last ALT and AST were in normal range in 12/2022, mildly elevated total bilirubin. Advise caution with alcohol intake. Abdomen/pelvis CT done in 12/2009 did not show any liver or gallbladder abnormality.  May consider liver US depending of lab results. We will continue monitoring regularly.

## 2023-07-26 NOTE — Patient Instructions (Addendum)
A few things to remember from today's visit:  Essential (primary) hypertension - Plan: Basic metabolic panel  Elevated transaminase level - Plan: Hepatic function panel No changes today.  If you need refills for medications you take chronically, please call your pharmacy. Do not use My Chart to request refills or for acute issues that need immediate attention. If you send a my chart message, it may take a few days to be addressed, specially if I am not in the office.  Please be sure medication list is accurate. If a new problem present, please set up appointment sooner than planned today.

## 2023-07-27 ENCOUNTER — Encounter: Payer: Self-pay | Admitting: Family Medicine

## 2023-07-27 NOTE — Addendum Note (Signed)
Addended by: Kathreen Devoid on: 07/27/2023 09:52 AM   Modules accepted: Orders

## 2023-08-23 DIAGNOSIS — H2511 Age-related nuclear cataract, right eye: Secondary | ICD-10-CM | POA: Diagnosis not present

## 2023-08-23 DIAGNOSIS — H43811 Vitreous degeneration, right eye: Secondary | ICD-10-CM | POA: Diagnosis not present

## 2023-08-23 DIAGNOSIS — H59812 Chorioretinal scars after surgery for detachment, left eye: Secondary | ICD-10-CM | POA: Diagnosis not present

## 2023-08-23 DIAGNOSIS — H43391 Other vitreous opacities, right eye: Secondary | ICD-10-CM | POA: Diagnosis not present

## 2024-01-25 ENCOUNTER — Other Ambulatory Visit: Payer: Self-pay
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
## 2024-01-27 ENCOUNTER — Ambulatory Visit (INDEPENDENT_AMBULATORY_CARE_PROVIDER_SITE_OTHER): Payer: Medicare PPO | Admitting: Family Medicine

## 2024-01-27 ENCOUNTER — Encounter: Payer: Self-pay | Admitting: Family Medicine

## 2024-01-27 VITALS — BP 122/70 | HR 70 | Temp 98.1°F | Resp 16 | Ht 65.0 in | Wt 162.1 lb

## 2024-01-27 DIAGNOSIS — R7401 Elevation of levels of liver transaminase levels: Secondary | ICD-10-CM

## 2024-01-27 DIAGNOSIS — E7800 Pure hypercholesterolemia, unspecified: Secondary | ICD-10-CM

## 2024-01-27 DIAGNOSIS — N401 Enlarged prostate with lower urinary tract symptoms: Secondary | ICD-10-CM

## 2024-01-27 DIAGNOSIS — Z0000 Encounter for general adult medical examination without abnormal findings: Secondary | ICD-10-CM | POA: Diagnosis not present

## 2024-01-27 DIAGNOSIS — R351 Nocturia: Secondary | ICD-10-CM | POA: Diagnosis not present

## 2024-01-27 DIAGNOSIS — I10 Essential (primary) hypertension: Secondary | ICD-10-CM | POA: Diagnosis not present

## 2024-01-27 DIAGNOSIS — R7309 Other abnormal glucose: Secondary | ICD-10-CM

## 2024-01-27 LAB — BASIC METABOLIC PANEL
BUN: 17 mg/dL (ref 6–23)
CO2: 28 meq/L (ref 19–32)
Calcium: 9.6 mg/dL (ref 8.4–10.5)
Chloride: 97 meq/L (ref 96–112)
Creatinine, Ser: 0.91 mg/dL (ref 0.40–1.50)
GFR: 83.99 mL/min (ref >60.00)
Glucose, Bld: 98 mg/dL (ref 70–99)
Potassium: 4 meq/L (ref 3.5–5.1)
Sodium: 134 meq/L — ABNORMAL LOW (ref 135–145)

## 2024-01-27 LAB — TSH: TSH: 4.4 u[IU]/mL (ref 0.35–5.50)

## 2024-01-27 LAB — LIPID PANEL
Cholesterol: 165 mg/dL (ref 0–200)
HDL: 52.6 mg/dL (ref >39.00)
LDL Cholesterol: 98 mg/dL (ref 0–99)
NonHDL: 112.68
Total CHOL/HDL Ratio: 3
Triglycerides: 71 mg/dL (ref 0.0–149.0)
VLDL: 14.2 mg/dL (ref 0.0–40.0)

## 2024-01-27 LAB — HEPATIC FUNCTION PANEL
ALT: 30 U/L (ref 0–53)
AST: 27 U/L (ref 0–37)
Albumin: 4.6 g/dL (ref 3.5–5.2)
Alkaline Phosphatase: 47 U/L (ref 39–117)
Bilirubin, Direct: 0.2 mg/dL (ref 0.0–0.3)
Total Bilirubin: 0.9 mg/dL (ref 0.2–1.2)
Total Protein: 7.2 g/dL (ref 6.0–8.3)

## 2024-01-27 LAB — HEMOGLOBIN A1C: Hgb A1c MFr Bld: 5.7 % (ref 4.6–6.5)

## 2024-01-27 LAB — PSA: PSA: 3.86 ng/mL (ref 0.10–4.00)

## 2024-01-27 NOTE — Patient Instructions (Addendum)
A few things to remember from today's visit:  Routine general medical examination at a health care facility  Pure hypercholesterolemia - Plan: Lipid panel  Essential (primary) hypertension - Plan: Basic metabolic panel, TSH  Elevated glucose level - Plan: Hemoglobin A1c  Elevated transaminase level - Plan: Hepatic function panel  BPH associated with nocturia - Plan: PSA  If you need refills for medications you take chronically, please call your pharmacy. Do not use My Chart to request refills or for acute issues that need immediate attention. If you send a my chart message, it may take a few days to be addressed, specially if I am not in the office.  Please be sure medication list is accurate. If a new problem present, please set up appointment sooner than planned today.

## 2024-01-27 NOTE — Assessment & Plan Note (Signed)
He has decreased alcohol intake. Further recommendation will be given according to LFT result.

## 2024-01-27 NOTE — Assessment & Plan Note (Signed)
BP adequately controlled. Continue lisinopril-HCTZ 20-25 mg daily and low-salt diet. Eye exam is current. Continue monitoring BP regularly. Follow-up in 6-12 months, he would like the latter one, which is appropriate as far as BP is stable.

## 2024-01-27 NOTE — Assessment & Plan Note (Signed)
We discussed the importance of regular physical activity and healthy diet for prevention of chronic illness and/or complications. Preventive guidelines reviewed. Vaccination up to date. Next CPE in a year. 

## 2024-01-27 NOTE — Assessment & Plan Note (Signed)
He would like eventually try to decrease statin dose. Last LDL 124 in 12/2022. For now continue rosuvastatin 20 mg daily at low-fat diet. Further recommendation will be given according to lipid panel result.

## 2024-01-30 MED ORDER — LISINOPRIL-HYDROCHLOROTHIAZIDE 20-25 MG PO TABS: 1.0000 | ORAL_TABLET | Freq: Every day | ORAL | 2 refills | Status: DC

## 2024-01-30 MED ORDER — ROSUVASTATIN CALCIUM 20 MG PO TABS: 20.0000 mg | ORAL_TABLET | Freq: Every day | ORAL | 3 refills | Status: AC

## 2024-02-15 ENCOUNTER — Ambulatory Visit: Payer: Medicare PPO

## 2024-02-15 VITALS — Ht 65.0 in | Wt 162.0 lb

## 2024-02-15 DIAGNOSIS — Z0000 Encounter for general adult medical examination without abnormal findings: Secondary | ICD-10-CM
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
## 2024-02-15 NOTE — Patient Instructions (Addendum)
[name] , Thank you for taking time to come for your Medicare Wellness Visit. I appreciate your ongoing commitment to your health goals. Please review the following plan we discussed and let me know if I can assist you in the future.   Referrals/Orders/Follow-Ups/Clinician Recommendations:   This is a list of the screening recommended for you and due dates:  Health Maintenance  Topic Date Due   COVID-19 Vaccine (5 - 2024-25 season) 12/03/2023   Medicare Annual Wellness Visit  02/14/2025   Colon Cancer Screening  03/11/2026   DTaP/Tdap/Td vaccine (5 - Td or Tdap) 07/19/2033   Pneumonia Vaccine  Completed   Flu Shot  Completed   Hepatitis C Screening  Completed   Zoster (Shingles) Vaccine  Completed   HPV Vaccine  Aged Out    Advanced directives: (In Chart) A copy of your advanced directives are scanned into your chart should your provider ever need it.  Next Medicare Annual Wellness Visit scheduled for next year: Yes

## 2024-02-29 DIAGNOSIS — T85398S Other mechanical complication of other ocular prosthetic devices, implants and grafts, sequela: Secondary | ICD-10-CM | POA: Diagnosis not present

## 2024-02-29 DIAGNOSIS — H2511 Age-related nuclear cataract, right eye: Secondary | ICD-10-CM | POA: Diagnosis not present

## 2024-02-29 DIAGNOSIS — H43811 Vitreous degeneration, right eye: Secondary | ICD-10-CM | POA: Diagnosis not present

## 2024-02-29 DIAGNOSIS — H43391 Other vitreous opacities, right eye: Secondary | ICD-10-CM | POA: Diagnosis not present

## 2024-02-29 DIAGNOSIS — H31092 Other chorioretinal scars, left eye: Secondary | ICD-10-CM | POA: Diagnosis not present

## 2024-03-12 ENCOUNTER — Encounter: Payer: Self-pay | Admitting: Family Medicine

## 2024-03-12 DIAGNOSIS — I10 Essential (primary) hypertension: Secondary | ICD-10-CM

## 2024-03-12 MED ORDER — LISINOPRIL-HYDROCHLOROTHIAZIDE 20-25 MG PO TABS: 1.0000 | ORAL_TABLET | Freq: Every day | ORAL | 3 refills | Status: AC

## 2024-06-26 DIAGNOSIS — L42 Pityriasis rosea: Secondary | ICD-10-CM | POA: Diagnosis not present

## 2024-09-10 DIAGNOSIS — T85398D Other mechanical complication of other ocular prosthetic devices, implants and grafts, subsequent encounter: Secondary | ICD-10-CM | POA: Diagnosis not present

## 2024-09-10 DIAGNOSIS — H43811 Vitreous degeneration, right eye: Secondary | ICD-10-CM | POA: Diagnosis not present

## 2024-09-10 DIAGNOSIS — H35373 Puckering of macula, bilateral: Secondary | ICD-10-CM | POA: Diagnosis not present

## 2024-09-10 DIAGNOSIS — H40052 Ocular hypertension, left eye: Secondary | ICD-10-CM | POA: Diagnosis not present

## 2024-09-10 DIAGNOSIS — H2511 Age-related nuclear cataract, right eye: Secondary | ICD-10-CM | POA: Diagnosis not present

## 2024-09-10 DIAGNOSIS — H26492 Other secondary cataract, left eye: Secondary | ICD-10-CM | POA: Diagnosis not present

## 2024-09-10 DIAGNOSIS — H31092 Other chorioretinal scars, left eye: Secondary | ICD-10-CM | POA: Diagnosis not present

## 2024-09-10 DIAGNOSIS — H3581 Retinal edema: Secondary | ICD-10-CM | POA: Diagnosis not present

## 2024-09-10 DIAGNOSIS — H43391 Other vitreous opacities, right eye: Secondary | ICD-10-CM | POA: Diagnosis not present

## 2024-09-30 ENCOUNTER — Encounter: Payer: Self-pay | Admitting: Family Medicine

## 2024-10-08 DIAGNOSIS — Z48810 Encounter for surgical aftercare following surgery on the sense organs: Secondary | ICD-10-CM | POA: Diagnosis not present

## 2024-10-08 DIAGNOSIS — H35372 Puckering of macula, left eye: Secondary | ICD-10-CM | POA: Diagnosis not present

## 2024-10-08 DIAGNOSIS — T85398D Other mechanical complication of other ocular prosthetic devices, implants and grafts, subsequent encounter: Secondary | ICD-10-CM | POA: Diagnosis not present

## 2024-10-08 DIAGNOSIS — Z8669 Personal history of other diseases of the nervous system and sense organs: Secondary | ICD-10-CM | POA: Diagnosis not present

## 2024-10-08 DIAGNOSIS — H26492 Other secondary cataract, left eye: Secondary | ICD-10-CM | POA: Diagnosis not present

## 2024-10-16 DIAGNOSIS — H35372 Puckering of macula, left eye: Secondary | ICD-10-CM | POA: Diagnosis not present

## 2024-10-16 DIAGNOSIS — H3581 Retinal edema: Secondary | ICD-10-CM | POA: Diagnosis not present

## 2024-10-16 DIAGNOSIS — Z9889 Other specified postprocedural states: Secondary | ICD-10-CM | POA: Diagnosis not present

## 2024-10-16 DIAGNOSIS — H4312 Vitreous hemorrhage, left eye: Secondary | ICD-10-CM | POA: Diagnosis not present

## 2024-10-16 DIAGNOSIS — T85398D Other mechanical complication of other ocular prosthetic devices, implants and grafts, subsequent encounter: Secondary | ICD-10-CM | POA: Diagnosis not present

## 2024-10-16 DIAGNOSIS — H26492 Other secondary cataract, left eye: Secondary | ICD-10-CM | POA: Diagnosis not present

## 2024-10-17 ENCOUNTER — Encounter: Payer: Self-pay | Admitting: Family Medicine

## 2024-11-13 DIAGNOSIS — Z9889 Other specified postprocedural states: Secondary | ICD-10-CM | POA: Diagnosis not present

## 2024-11-13 DIAGNOSIS — H35372 Puckering of macula, left eye: Secondary | ICD-10-CM | POA: Diagnosis not present

## 2024-11-13 DIAGNOSIS — T85398D Other mechanical complication of other ocular prosthetic devices, implants and grafts, subsequent encounter: Secondary | ICD-10-CM | POA: Diagnosis not present

## 2024-11-13 DIAGNOSIS — H3581 Retinal edema: Secondary | ICD-10-CM | POA: Diagnosis not present

## 2024-11-13 DIAGNOSIS — H4312 Vitreous hemorrhage, left eye: Secondary | ICD-10-CM | POA: Diagnosis not present

## 2024-11-13 DIAGNOSIS — H26492 Other secondary cataract, left eye: Secondary | ICD-10-CM | POA: Diagnosis not present

## 2024-12-04 DIAGNOSIS — H26492 Other secondary cataract, left eye: Secondary | ICD-10-CM | POA: Diagnosis not present

## 2024-12-04 DIAGNOSIS — H4312 Vitreous hemorrhage, left eye: Secondary | ICD-10-CM | POA: Diagnosis not present

## 2024-12-04 DIAGNOSIS — T85398D Other mechanical complication of other ocular prosthetic devices, implants and grafts, subsequent encounter: Secondary | ICD-10-CM | POA: Diagnosis not present

## 2024-12-04 DIAGNOSIS — H35372 Puckering of macula, left eye: Secondary | ICD-10-CM | POA: Diagnosis not present

## 2024-12-04 DIAGNOSIS — H3581 Retinal edema: Secondary | ICD-10-CM | POA: Diagnosis not present

## 2024-12-04 DIAGNOSIS — Z9889 Other specified postprocedural states: Secondary | ICD-10-CM | POA: Diagnosis not present

## 2025-01-28 ENCOUNTER — Encounter: Admitting: Family Medicine

## 2025-02-04 ENCOUNTER — Encounter: Admitting: Family Medicine

## 2025-02-15 ENCOUNTER — Ambulatory Visit

## 2025-02-20 ENCOUNTER — Ambulatory Visit: Payer: Medicare PPO
